# Patient Record
Sex: Male | Born: 1985 | Hispanic: No | Marital: Single | Smoking: Current every day smoker
Health system: Southern US, Community
[De-identification: ages and names within clinical notes are randomized; demographics above are authoritative.]

## PROBLEM LIST (undated history)

## (undated) DIAGNOSIS — B009 Herpesviral infection, unspecified: Secondary | ICD-10-CM

## (undated) DIAGNOSIS — F329 Major depressive disorder, single episode, unspecified: Secondary | ICD-10-CM

## (undated) DIAGNOSIS — F419 Anxiety disorder, unspecified: Secondary | ICD-10-CM

## (undated) DIAGNOSIS — T7840XA Allergy, unspecified, initial encounter: Secondary | ICD-10-CM

## (undated) DIAGNOSIS — F1910 Other psychoactive substance abuse, uncomplicated: Secondary | ICD-10-CM

## (undated) DIAGNOSIS — F32A Depression, unspecified: Secondary | ICD-10-CM

## (undated) HISTORY — DX: Anxiety disorder, unspecified: F41.9

## (undated) HISTORY — DX: Depression, unspecified: F32.A

## (undated) HISTORY — DX: Major depressive disorder, single episode, unspecified: F32.9

## (undated) HISTORY — DX: Allergy, unspecified, initial encounter: T78.40XA

## (undated) HISTORY — DX: Other psychoactive substance abuse, uncomplicated: F19.10

---
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
## 2013-04-03 ENCOUNTER — Emergency Department (HOSPITAL_COMMUNITY)
Admission: EM | Admit: 2013-04-03 | Discharge: 2013-04-04 | Disposition: A | Discharge status: Home / Self Care | Payer: Self-pay | Attending: Emergency Medicine | Admitting: Emergency Medicine

## 2013-04-03 ENCOUNTER — Encounter (HOSPITAL_COMMUNITY): Payer: Self-pay | Admitting: Emergency Medicine

## 2013-04-03 DIAGNOSIS — T7840XA Allergy, unspecified, initial encounter: Secondary | ICD-10-CM

## 2013-04-03 DIAGNOSIS — F17200 Nicotine dependence, unspecified, uncomplicated: Secondary | ICD-10-CM | POA: Insufficient documentation

## 2013-04-03 DIAGNOSIS — R220 Localized swelling, mass and lump, head: Secondary | ICD-10-CM | POA: Insufficient documentation

## 2013-04-03 DIAGNOSIS — T495X1A Poisoning by ophthalmological drugs and preparations, accidental (unintentional), initial encounter: Secondary | ICD-10-CM | POA: Insufficient documentation

## 2013-04-03 DIAGNOSIS — R221 Localized swelling, mass and lump, neck: Secondary | ICD-10-CM

## 2013-04-03 DIAGNOSIS — Y929 Unspecified place or not applicable: Secondary | ICD-10-CM | POA: Insufficient documentation

## 2013-04-03 DIAGNOSIS — R21 Rash and other nonspecific skin eruption: Secondary | ICD-10-CM | POA: Insufficient documentation

## 2013-04-03 DIAGNOSIS — Y9389 Activity, other specified: Secondary | ICD-10-CM | POA: Insufficient documentation

## 2013-04-03 NOTE — ED Notes (Signed)
Pt arrived to the ED with a complaint of a allergic reaction. 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  Pt's symptoms have been present for the last three days but only at night.  Pt has red rash all over his body.  Pt has a swollen lip that has been present since tonight.  Pt is in no apparrent distress and able to verbalize complaint without effort.

## 2013-04-04 MED ORDER — PREDNISONE 20 MG PO TABS
60.0000 mg | ORAL_TABLET | Freq: Once | ORAL | Status: AC
  Administered 2013-04-04: 60 mg via ORAL
  Filled 2013-04-04: qty 3

## 2013-04-04 MED ORDER — DIPHENHYDRAMINE HCL 25 MG PO CAPS
25.0000 mg | ORAL_CAPSULE | Freq: Once | ORAL | Status: AC
  Administered 2013-04-04: 25 mg via ORAL
  Filled 2013-04-04: qty 1

## 2013-04-04 MED ORDER — PREDNISONE 20 MG PO TABS: 60.0000 mg | ORAL_TABLET | Freq: Every day | ORAL | Status: DC

## 2013-04-04 MED ORDER — DIPHENHYDRAMINE HCL 25 MG PO CAPS
25.0000 mg | ORAL_CAPSULE | Freq: Four times a day (QID) | ORAL | Status: DC | PRN
Start: 2013-04-04 — End: 2013-06-10

## 2013-04-04 MED ORDER — FAMOTIDINE 20 MG PO TABS
20.0000 mg | ORAL_TABLET | Freq: Once | ORAL | Status: AC
  Administered 2013-04-04: 20 mg via ORAL
  Filled 2013-04-04: qty 1

## 2013-04-04 MED ORDER — EPINEPHRINE 0.3 MG/0.3ML IJ SOAJ: 0.3000 mg | INTRAMUSCULAR | Status: DC | PRN

## 2013-04-04 NOTE — Discharge Instructions (Signed)
Allergies °Allergies may happen from anything your body is sensitive to. This may be food, medicines, pollens, chemicals, and nearly anything around you in everyday life that produces allergens. An allergen is anything that causes an allergy producing substance. Heredity is often a factor in causing these problems. This means you may have some of the same allergies as your parents. °Food allergies happen in all age groups. Food allergies are some of the most severe and life threatening. Some common food allergies are cow's milk, seafood, eggs, nuts, wheat, and soybeans. °SYMPTOMS  °· Swelling around the mouth. °· An itchy red rash or hives. °· Vomiting or diarrhea. °· Difficulty breathing. °SEVERE ALLERGIC REACTIONS ARE LIFE-THREATENING. °This reaction is called anaphylaxis. It can cause the mouth and throat to swell and cause difficulty with breathing and swallowing. In severe reactions only a trace amount of food (for example, peanut oil in a salad) may cause death within seconds. °Seasonal allergies occur in all age groups. These are seasonal because they usually occur during the same season every year. They may be a reaction to molds, grass pollens, or tree pollens. Other causes of problems are house dust mite allergens, pet dander, and mold spores. The symptoms often consist of nasal congestion, a runny itchy nose associated with sneezing, and tearing itchy eyes. There is often an associated itching of the mouth and ears. The problems happen when you come in contact with pollens and other allergens. Allergens are the particles in the air that the body reacts to with an allergic reaction. This causes you to release allergic antibodies. Through a chain of events, these eventually cause you to release histamine into the blood stream. Although it is meant to be protective to the body, it is this release that causes your discomfort. This is why you were given anti-histamines to feel better.  If you are unable to  pinpoint the offending allergen, it may be determined by skin or blood testing. Allergies cannot be cured but can be controlled with medicine. °Hay fever is a collection of all or some of the seasonal allergy problems. It may often be treated with simple over-the-counter medicine such as diphenhydramine. Take medicine as directed. Do not drink alcohol or drive while taking this medicine. Check with your caregiver or package insert for child dosages. °If these medicines are not effective, there are many new medicines your caregiver can prescribe. Stronger medicine such as nasal spray, eye drops, and corticosteroids may be used if the first things you try do not work well. Other treatments such as immunotherapy or desensitizing injections can be used if all else fails. Follow up with your caregiver if problems continue. These seasonal allergies are usually not life threatening. They are generally more of a nuisance that can often be handled using medicine. °HOME CARE INSTRUCTIONS  °· If unsure what causes a reaction, keep a diary of foods eaten and symptoms that follow. Avoid foods that cause reactions. °· If hives or rash are present: °· Take medicine as directed. °· You may use an over-the-counter antihistamine (diphenhydramine) for hives and itching as needed. °· Apply cold compresses (cloths) to the skin or take baths in cool water. Avoid hot baths or showers. Heat will make a rash and itching worse. °· If you are severely allergic: °· Following a treatment for a severe reaction, hospitalization is often required for closer follow-up. °· Wear a medic-alert bracelet or necklace stating the allergy. °· You and your family must learn how to give adrenaline or use   an anaphylaxis kit. °· If you have had a severe reaction, always carry your anaphylaxis kit or EpiPen® with you. Use this medicine as directed by your caregiver if a severe reaction is occurring. Failure to do so could have a fatal outcome. °SEEK MEDICAL  CARE IF: °· You suspect a food allergy. Symptoms generally happen within 30 minutes of eating a food. °· Your symptoms have not gone away within 2 days or are getting worse. °· You develop new symptoms. °· You want to retest yourself or your child with a food or drink you think causes an allergic reaction. Never do this if an anaphylactic reaction to that food or drink has happened before. Only do this under the care of a caregiver. °SEEK IMMEDIATE MEDICAL CARE IF:  °· You have difficulty breathing, are wheezing, or have a tight feeling in your chest or throat. °· You have a swollen mouth, or you have hives, swelling, or itching all over your body. °· You have had a severe reaction that has responded to your anaphylaxis kit or an EpiPen®. These reactions may return when the medicine has worn off. These reactions should be considered life threatening. °MAKE SURE YOU:  °· Understand these instructions. °· Will watch your condition. °· Will get help right away if you are not doing well or get worse. °Document Released: 05/07/2002 Document Revised: 06/08/2012 Document Reviewed: 10/12/2007 °[employer]® Patient Information ©2014 [employer]. ° °

## 2013-04-04 NOTE — ED Provider Notes (Signed)
CSN: [id number]     Arrival date & time 04/03/13  2314 History   First MD Initiated Contact with Patient 04/04/13 0010     Chief Complaint  Patient presents with  . Allergic Reaction   (Consider location/radiation/quality/duration/timing/severity/associated sxs/prior Treatment) HPI History provided by patient. itchy red rash to extremities and torso onset 3 days ago. No difficulty breathing. No throat swelling. Tonight developed associated swelling to his upper lip prompted him to come to emergency apartment. The swelling is now better - he still has itchy red rash all over. No medications. He denies any new foods. He did recently buy a new detergent and new soap. Symptoms moderate in severity. No history of same. No other known allergens/ exposures.  History reviewed. No pertinent past medical history. History reviewed. No pertinent past surgical history. History reviewed. No pertinent family history. History  Substance Use Topics  . Smoking status: Current Every Day Smoker  . Smokeless tobacco: Not on file  . Alcohol Use: No    Review of Systems  Constitutional: Negative for fever and chills.  HENT: Negative for sore throat, trouble swallowing and voice change.   Eyes: Negative for itching.  Respiratory: Negative for shortness of breath.   Cardiovascular: Negative for chest pain.  Gastrointestinal: Negative for vomiting and abdominal pain.  Genitourinary: Negative for dysuria.  Musculoskeletal: Negative for joint swelling.  Skin: Positive for rash.  Neurological: Negative for headaches.  All other systems reviewed and are negative.    Allergies  Review of patient's allergies indicates no known allergies.  Home Medications  No current outpatient prescriptions on file. BP 120/69  Pulse 100  Temp(Src) 98.4 F (36.9 C) (Oral)  Resp 16  SpO2 93% Physical Exam  Constitutional: He is oriented to person, place, and time. He appears well-developed and well-nourished.  HENT:   Head: Normocephalic and atraumatic.  Mouth/Throat: Oropharynx is clear and moist.  Uvula midline. No angioedema. No lingual, oral or lip swelling appreciated  Eyes: EOM are normal. Pupils are equal, round, and reactive to light.  Neck: Neck supple.  Cardiovascular: Normal rate, regular rhythm and intact distal pulses.   Pulmonary/Chest: Effort normal. No stridor. No respiratory distress. He has no wheezes.  Abdominal: Soft. He exhibits no distension. There is no tenderness.  Musculoskeletal: Normal range of motion. He exhibits no edema.  Neurological: He is alert and oriented to person, place, and time.  Skin: Skin is warm and dry.  Patchy, erythematous, blanching rash involving the torso and extremities. No palmar or oral lesions    ED Course  Procedures (including critical care time) Labs Review Labs Reviewed - No data to display Imaging Review No results found.  Prednisone, Benadryl, Pepcid provided  On recheck, itching and rash improving. No airway involvement.  No anaphylaxis.  Patient agrees to stop new soap or detergent. He will keep a food journal. Prescription for Benadryl and EpiPen. Outpatient resources and referral provided. Patient agrees to strict return precautions. MDM  Diagnosis: Allergic reaction  Likely reaction to new soap/ detergent at home.  Improved with medications provided Vital signs nursing notes reviewed and considered        Sunnie NielsenBrian Mikail Goostree, MD 04/04/13 [phone]

## 2013-06-10 ENCOUNTER — Emergency Department (HOSPITAL_COMMUNITY)
Admission: EM | Admit: 2013-06-10 | Discharge: 2013-06-10 | Disposition: A | Discharge status: Home / Self Care | Payer: Self-pay | Attending: Emergency Medicine | Admitting: Emergency Medicine

## 2013-06-10 ENCOUNTER — Encounter (HOSPITAL_COMMUNITY): Payer: Self-pay | Admitting: Emergency Medicine

## 2013-06-10 DIAGNOSIS — J029 Acute pharyngitis, unspecified: Secondary | ICD-10-CM | POA: Insufficient documentation

## 2013-06-10 DIAGNOSIS — F39 Unspecified mood [affective] disorder: Secondary | ICD-10-CM | POA: Insufficient documentation

## 2013-06-10 DIAGNOSIS — F3289 Other specified depressive episodes: Secondary | ICD-10-CM | POA: Insufficient documentation

## 2013-06-10 DIAGNOSIS — R197 Diarrhea, unspecified: Secondary | ICD-10-CM | POA: Insufficient documentation

## 2013-06-10 DIAGNOSIS — N509 Disorder of male genital organs, unspecified: Secondary | ICD-10-CM | POA: Insufficient documentation

## 2013-06-10 DIAGNOSIS — F17200 Nicotine dependence, unspecified, uncomplicated: Secondary | ICD-10-CM | POA: Insufficient documentation

## 2013-06-10 DIAGNOSIS — F329 Major depressive disorder, single episode, unspecified: Secondary | ICD-10-CM | POA: Insufficient documentation

## 2013-06-10 DIAGNOSIS — M545 Low back pain, unspecified: Secondary | ICD-10-CM | POA: Insufficient documentation

## 2013-06-10 MED ORDER — METHOCARBAMOL 500 MG PO TABS
500.0000 mg | ORAL_TABLET | Freq: Four times a day (QID) | ORAL | Status: DC | PRN
Start: 2013-06-10 — End: 2015-11-15

## 2013-06-10 MED ORDER — IBUPROFEN 800 MG PO TABS: 800.0000 mg | ORAL_TABLET | Freq: Three times a day (TID) | ORAL | Status: DC | PRN

## 2013-06-10 NOTE — ED Provider Notes (Signed)
Medical screening examination/treatment/procedure(s) were performed by non-physician practitioner and as supervising physician I was immediately available for consultation/collaboration.   EKG Interpretation None        Chisum Habenicht M Levester Waldridge, MD 06/10/13 1553 

## 2013-06-10 NOTE — Discharge Instructions (Signed)
Read the information below.  Use the prescribed medication as directed.  Please discuss all new medications with your pharmacist.  You may return to the Emergency Department at any time for worsening condition or any new symptoms that concern you.   If you develop fevers, loss of control of bowel or bladder, weakness or numbness in your legs, or are unable to walk, return to the ER for a recheck.    Back Exercises Back exercises help treat and prevent back injuries. The goal of back exercises is to increase the strength of your abdominal and back muscles and the flexibility of your back. These exercises should be started when you no longer have back pain. Back exercises include:  Pelvic Tilt. Lie on your back with your knees bent. Tilt your pelvis until the lower part of your back is against the floor. Hold this position 5 to 10 sec and repeat 5 to 10 times.  Knee to Chest. Pull first 1 knee up against your chest and hold for 20 to 30 seconds, repeat this with the other knee, and then both knees. This may be done with the other leg straight or bent, whichever feels better.  Sit-Ups or Curl-Ups. Bend your knees 90 degrees. Start with tilting your pelvis, and do a partial, slow sit-up, lifting your trunk only 30 to 45 degrees off the floor. Take at least 2 to 3 seconds for each sit-up. Do not do sit-ups with your knees out straight. If partial sit-ups are difficult, simply do the above but with only tightening your abdominal muscles and holding it as directed.  Hip-Lift. Lie on your back with your knees flexed 90 degrees. Push down with your feet and shoulders as you raise your hips a couple inches off the floor; hold for 10 seconds, repeat 5 to 10 times.  Back arches. Lie on your stomach, propping yourself up on bent elbows. Slowly press on your hands, causing an arch in your low back. Repeat 3 to 5 times. Any initial stiffness and discomfort should lessen with repetition over time.  Shoulder-Lifts.  Lie face down with arms beside your body. Keep hips and torso pressed to floor as you slowly lift your head and shoulders off the floor. Do not overdo your exercises, especially in the beginning. Exercises may cause you some mild back discomfort which lasts for a few minutes; however, if the pain is more severe, or lasts for more than 15 minutes, do not continue exercises until you see your caregiver. Improvement with exercise therapy for back problems is slow.  See your caregivers for assistance with developing a proper back exercise program. Document Released: 03/21/2004 Document Revised: 05/06/2011 Document Reviewed: 12/13/2010 [facility] Patient Information 2014 [place], [place].  Back Pain, Adult Low back pain is very common. About 1 in 5 people have back pain.The cause of low back pain is rarely dangerous. The pain often gets better over time.About half of people with a sudden onset of back pain feel better in just 2 weeks. About 8 in 10 people feel better by 6 weeks.  CAUSES Some common causes of back pain include:  Strain of the muscles or ligaments supporting the spine.  Wear and tear (degeneration) of the spinal discs.  Arthritis.  Direct injury to the back. DIAGNOSIS Most of the time, the direct cause of low back pain is not known.However, back pain can be treated effectively even when the exact cause of the pain is unknown.Answering your caregiver's questions about your overall health and  symptoms is one of the most accurate ways to make sure the cause of your pain is not dangerous. If your caregiver needs more information, he or she may order lab work or imaging tests (X-rays or MRIs).However, even if imaging tests show changes in your back, this usually does not require surgery. HOME CARE INSTRUCTIONS For many people, back pain returns.Since low back pain is rarely dangerous, it is often a condition that people can learn to [facility] Hospitalmanageon their own.   Remain active. It is stressful  on the back to sit or stand in one place. Do not sit, drive, or stand in one place for more than 30 minutes at a time. Take short walks on level surfaces as soon as pain allows.Try to increase the length of time you walk each day.  Do not stay in bed.Resting more than 1 or 2 days can delay your recovery.  Do not avoid exercise or work.Your body is made to move.It is not dangerous to be active, even though your back may hurt.Your back will likely heal faster if you return to being active before your pain is gone.  Pay attention to your body when you bend and lift. Many people have less discomfortwhen lifting if they bend their knees, keep the load close to their bodies,and avoid twisting. Often, the most comfortable positions are those that put less stress on your recovering back.  Find a comfortable position to sleep. Use a firm mattress and lie on your side with your knees slightly bent. If you lie on your back, put a pillow under your knees.  Only take over-the-counter or prescription medicines as directed by your caregiver. Over-the-counter medicines to reduce pain and inflammation are often the most helpful.Your caregiver may prescribe muscle relaxant drugs.These medicines help dull your pain so you can more quickly return to your normal activities and healthy exercise.  Put ice on the injured area.  Put ice in a plastic bag.  Place a towel between your skin and the bag.  Leave the ice on for 15-20 minutes, 03-04 times a day for the first 2 to 3 days. After that, ice and heat may be alternated to reduce pain and spasms.  Ask your caregiver about trying back exercises and gentle massage. This may be of some benefit.  Avoid feeling anxious or stressed.Stress increases muscle tension and can worsen back pain.It is important to recognize when you are anxious or stressed and learn ways to manage it.Exercise is a great option. SEEK MEDICAL CARE IF:  You have pain that is not  relieved with rest or medicine.  You have pain that does not improve in 1 week.  You have new symptoms.  You are generally not feeling well. SEEK IMMEDIATE MEDICAL CARE IF:   You have pain that radiates from your back into your legs.  You develop new bowel or bladder control problems.  You have unusual weakness or numbness in your arms or legs.  You develop nausea or vomiting.  You develop abdominal pain.  You feel faint. Document Released: 02/11/2005 Document Revised: 08/13/2011 Document Reviewed: 07/02/2010 [facility]Exit[facility] Patient Information 2014 [place]ExitCare, [place]LLC.   Emergency Department Resource Guide 1) Find a Doctor and Pay Out of Pocket Although you won't have to find out who is covered by your insurance plan, it is a good idea to ask around and get recommendations. You will then need to call the office and see if the doctor you have chosen will accept you as a new patient and  what types of options they offer for patients who are self-pay. Some doctors offer discounts or will set up payment plans for their patients who do not have insurance, but you will need to ask so you aren't surprised when you get to your appointment.  2) Contact Your Local Health Department Not all health departments have doctors that can see patients for sick visits, but many do, so it is worth a call to see if yours does. If you don't know where your local health department is, you can check in your phone book. The CDC also has a tool to help you locate your state's health department, and many state websites also have listings of all of their local health departments.  3) Find a Walk-in Clinic If your illness is not likely to be very severe or complicated, you may want to try a walk in clinic. These are popping up all over the country in pharmacies, drugstores, and shopping centers. They're usually staffed by nurse practitioners or physician assistants that have been trained to treat common illnesses and  complaints. They're usually fairly quick and inexpensive. However, if you have serious medical issues or chronic medical problems, these are probably not your best option.  No Primary Care Doctor: - Call Health Connect at  [phone] - they can help you locate a primary care doctor that  accepts your insurance, provides certain services, etc. - Physician Referral Service- [phone]  Chronic Pain Problems: Organization         Address  Phone   Notes  [facility]  [phone] Patients need to be referred by their primary care doctor.   Medication Assistance: Organization         Address  Phone   Notes  [facility] Medication [facility] [address] [place], [place] [address] [phone] --Must be a resident of [place] -- Must have NO insurance coverage whatsoever (no Medicaid/ Medicare, etc.) -- The pt. MUST have a primary care doctor that directs their care regularly and follows them in the community   MedAssist  [phone]   [employer]  [phone]    Agencies that provide inexpensive medical care: Organization         Address  Phone   Notes  [facility]  [phone]   [facility]    [phone]   [facility] [address] [place], [place] [address] [phone]   [facility] [address], [place] [phone]) [phone]   [facility]anned Parenthood    [phone]   [facility]    [phone]   [facility]  [address], [place] Phone:  [phone], Fax:  [phone] Hours of Operation:  9 am - 6 pm, M-F.  Also accepts Medicaid/Medicare and self-pay.  [facility]  [address],  Phone: [phone], Fax: [phone]. Hours of Operation:  8:30 am - 5:30 pm, M-F.  Also accepts Medicaid and self-pay.  [facility] [place] [address], [place] Phone: [phone]) [phone]   [facility] [address], [place], [place] [phone], Ext. [phone] Mondays & Thursdays: 7-9 AM.  First 15 patients are seen on a first come, first serve basis.    Medicaid-accepting [facility] [place] Providers:  Retail buyerrganization         Address  Phone  Notes  [facility] [address], [place] [phone] Also accepts self-pay patients.  [facility] [address] Laurell Josephs [place], [place]  [phone]   [facility] [address], [place] [phone]   [facility] [address], [place] [phone]   Renaye Rakers [address], [place]   [phone] Only accepts [place] Access [place] patients after they have their name applied to their card.   Self-Pay (no insurance) in [facility]:  Organization         Address  Phone   Notes  Sickle Cell Patients, [facility] [address] [place], [place] [phone]   [facility] [address] [place], [place] [phone]   [facility]  [address], [place] [phone]   [facility]/Dr. Osei-Bonsu  [address] [place] or [address], [place] [phone] Phone number for both [place] and [place] locations is the same.  [facility]gent [facility] and [facility] [address], [place] [phone]   [facility] [address] [place] or [address] [phone]   [facility] [address], [place] [phone], phone; [phone], fax Sees patients 1st and 3rd Saturday of every month.  Must not qualify for public or private insurance (i.e. Medicaid, Medicare, [place] Health Choice, Veterans' Benefits)  Household income should be no more than 200% of the poverty level  The clinic cannot treat you if you are pregnant or think you are pregnant  Sexually transmitted diseases are not treated at the clinic.    Dental Care: Organization         Address  Phone  Notes  [facility] of [facility] [address] [place], [place] [phone] Accepts children up to age 32 who are enrolled in [place] or [place] Health Choice; pregnant women with a Medicaid card; and children who have applied for Medicaid or [place] Health Choice, but were declined, whose parents can pay a reduced fee at time of service.  [facility] of [facility]  [address], [place] [phone] Accepts children up to age 28 who are enrolled in [place] or [place] Health Choice; pregnant women with a Medicaid card; and children who have applied for Medicaid or [place] Health Choice, but were declined, whose parents can pay a reduced fee at time of service.  [facility]  [address] [place], [place] [phone] Patients are seen by appointment only. Walk-ins are not accepted. [facility] will see patients 25 years of age and older. Monday - Tuesday (8am-5pm) Most Wednesdays (8:30-5pm) $30 per visit, cash only  [facility] Adult Dental Access PROGRAM  [address], [facility] [phone] Patients are seen by appointment only. Walk-ins are not accepted. [facility] will see patients 60 years of age and older. One Wednesday Evening (Monthly: Volunteer Based).  $30 per visit, cash only  [employer]  [phone] for adults; Children under age 41, call Graduate Pediatric Dentistry at [phone]. Children aged 38-14, please call [phone] to request a pediatric application.  Dental services are provided in all areas of dental care including fillings, crowns and bridges, complete and  partial dentures, implants, gum treatment, root canals, and extractions. Preventive care is  also provided. Treatment is provided to both adults and children. Patients are selected via a lottery and there is often a waiting list.   [facility] [address] [place]  [phone] [email or web address]   [facility] [address] [place], [place] [phone], Ext. [phone] Second and Fourth Thursday of each month, opens at 6:30 AM; Clinic ends at 9 AM.  Patients are seen on a first-come first-served basis, and a limited number are seen during each clinic.   [facility]  [address] Ether Griffins [place], [place] [phone]   Eligibility Requirements You must have lived in [place], [place], or [place] counties for at least the last three months.   You cannot be eligible for state or federal sponsored [place], including [employer], [place], or [employer].   You generally cannot be eligible for healthcare insurance through your employer.    How to apply: Eligibility screenings are held every Tuesday and Wednesday afternoon from 1:00 pm until 4:00 pm. You do not need an appointment for the interview!  [facility] [address] [place], [place] [phone]   [facility] Health Department  [phone]   [facility] Health Department  [phone]   [facility] Health Department  [phone]    Behavioral Health Resources in the Community: Intensive Outpatient Programs Organization         Address  Phone  Notes  [facility] Services [address], [place], [place] [phone]   [facility] Outpatient [address] [place], [place] [phone]   ADS: Alcohol & Drug Svcs [address], [place], [place]  [phone]   [facility] Mental Health [address]  [place], [place] [phone] or [phone]   Substance Abuse Resources Organization         Address  Phone  Notes  Alcohol and Drug Services  [phone]   [facility]  ([phone]   [facility] [place]  ([phone]   Floydene Flock  [phone]   Residential & Outpatient Substance Abuse Program  [phone]   Psychological Services Organization         Address  Phone  Notes  [facility] Behavioral Health  [phone]   [facility] Services  6125973849   [facility] Mental Health [address] [place] [phone] or [phone]    Mobile Crisis Teams Organization         Address  Phone  Notes  [facility]rapeutic [facility]atives, Mobile Crisis Care Unit  [phone]   [facility]  [address] [place], [place] [phone]   Doristine Locks [address] [place] [phone]    Self-Help/Support Groups Organization         Address  Phone             Notes  Mental Health Assoc. of [place] - variety of support groups  336- I7437963 Call for more information  Narcotics Anonymous (NA), [facility] [address], [employer] [place]  2 meetings at this location   Statistician         Address  Phone  Notes  [facility] [address],    [place]  [phone]   [facility]  [address] [place] 8[address]841, [place], [place] [phone]   [facility] [address] [place], [place] [phone] Admissions: 8am-3pm M-F  [facility] [address],    [place], [place] [phone]   [facility] [address] [place], [place], [place] [phone]   [facility] [address],  [place], [place] [phone]   [facility] - Intensive Outpatient [address]ure[address], [place], [place] [phone]   [facility] (Addiction Recovery Care Assoc.) [address]  [place], [place] 2-841-324-4010 or [phone]   [facility] Treatment Services (RTS) [address] [place], [place] [phone] Accepts Medicaid  [facility] [place] [address],  [place] [phone]  Substance Abuse/Addiction Treatment   [facility] Organization         Address  Phone  Notes  [facility]  [phone]   Angie Fava, PhD [address] Ervin Knack [place], [place]   [phone] or 8431616112   [facility] Behavioral   [address] [place], [place] [phone]   [facility] [address], [place], [place] [phone] Insurance/Medicaid/sponsorship through [facility] and Families [address]                                    [place], [place] [phone] Therapy/tele-psych/case  [facility] [address] [place], [place] [phone]    Dr. Lolly Mustache  [phone]   [facility]  [employer] [facility] Dept. 1) [address], [place] 2) [address] [place] 3)  [address], [place] [phone]  [phone]   [facility] Child Abuse Hotline (754)796-0137 or 5137690790 (After Hours)      Behavioral Health Resources in the [facility]  Intensive Outpatient Programs: [facility]      [address] [place], [place] [phone] Both a day and evening program       [facility] Outpatient     [address]        [place], [place] [address] [phone]         ADS: Alcohol & Drug Svcs [address] [place] [phone]  [facility] Mental Health ACCESS LINE: 8782688222 or [phone] [address] [place], [place] [address] [email or web address]  Mobile Crisis Teams:                                        [facility]atives         Mobile Crisis Care Unit [phone]             [facility] [address] Ginette Otto [phone]                                         Interventionist [address] [place]  [place] [phone]  Self-Help/Support Groups: Mental Health Assoc. of [employer] of support groups (431)212-9197 (call for more info)  Narcotics Anonymous (NA) [facility] [address] [place] - 2 meetings at this location  Residential Treatment Programs:  [facility]      [address]        [place]  [phone]         [facility] [address] [place], [place]  [phone]  [facility]  [address] [place], [place] [address] [phone] Admissions: 8am-3pm M-F  [facility]     [address]        [place], [place] [address]       [phone]         [facility] [address] , [place] [phone]  [facility] [address] [place], [place] [phone]  [facility] - Intensive Outpatient      [address]     [place], [place]       [phone]         [facility] (Addiction Recovery Care Assoc.)     [address] [place], [place] [phone] or [phone]  [facility]idential Treatment Services (RTS)  [address] [place], [place] [phone]  [facility] [address]                                               [address] [place] [phone]  [facility] Resources: [place] [facility] Human Services514 [phone]               General Therapy                                                Angie FavaJulie Brannon, PhD        [address] [place]                                       [place], [place] [address]         [phone]   Insurance  [facility] [place]   [address] [place], [place] [address] [phone]  [facility] [address] [place], [place] [address] [phone] Insurance/Medicaid/sponsorship through [facility]  Faith and Families                                              [address]                                         [place], [place] [address]    Therapy/tele-psych/case         [phone]          [facility] [address].   [place], [place]  [phone]  Adolescent/group home/case management [phone]                                           Creola CornJulia Brannon PhD       General therapy       Insurance   [phone]         Dr. Lolly MustacheArfeen Insurance [phone]  M-F  [employer], sponsorship [phone]

## 2013-06-10 NOTE — Progress Notes (Signed)
[facility] provided pt with a list of primary care resouces to help patient establish primary care.

## 2013-06-10 NOTE — ED Notes (Addendum)
Pt c/o lower left sided back pain x 1 week, hurts when trying to get up. No hx of kidney stones. Denies n/v/d. Pt states he has chronic upper back pain.

## 2013-06-10 NOTE — ED Provider Notes (Signed)
CSN: [id number]     Arrival date & time 06/10/13  1221 History  This chart was scribed for non-physician practitioner working with  by Ashley JacobsBrittany Andrews, ED scribe. This patient was seen in room WTR5/WTR5 and the patient's care was started at 12:54 PM.   First MD Initiated Contact with Patient 06/10/13 1246     Chief Complaint  Patient presents with  . Back Pain     (Consider location/radiation/quality/duration/timing/severity/associated sxs/prior Treatment) Patient is a 28 y.o. male presenting with back pain. The history is provided by medical records and the patient. No language interpreter was used.  Back Pain Associated symptoms: no dysuria and no numbness    HPI Comments: Bob Wolf is a 28 y.o. male who presents to the Emergency Department complaining of constant severe left lumbar back pain, onset one week ago. The pain is 10/10 in severity with movement and radiates to his left upper back.  The pain is present only with movement. Pt had right thigh pain that occurred two weeks ago but has since resolved. Pt denies injury and laceration to his legs. He tried tried two Ibuprofen tablets twice a day.  He has a sore throat and rhinorrhea, developed over the past few days. He reports having a hx of allergies and is taking Claritin. Denies chest pain and SOB. Denies leg swelling. 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 Denies SI. He explains spending a great deal of his time in bed. Pt had to move in with his father.  Denies injury and new activities. Nothing seems to make his symptoms better.  Denies hx of IV drug use. Denies hx cancer.   History reviewed. No pertinent past medical history. Past Surgical History  Procedure Laterality Date  . Cholecystectomy     No family history on file. History  Substance Use Topics  . Smoking status: Current Every Day Smoker  . Smokeless tobacco: Not on file  . Alcohol Use: No    Review of  Systems  Constitutional: Positive for activity change.  HENT: Positive for rhinorrhea and sore throat.   Respiratory: Negative for shortness of breath.   Gastrointestinal: Positive for diarrhea (intermittent clear watery for the past 2-3 weeks). Negative for nausea, vomiting and blood in stool.  Genitourinary: Positive for testicular pain ("pulling" sensation, occured once 1 week ago, relieved completely and never recurred ). Negative for dysuria, urgency, hematuria, discharge, scrotal swelling, difficulty urinating and penile pain.  Musculoskeletal: Positive for back pain. Negative for arthralgias, gait problem and myalgias.  Skin: Negative for color change, rash and wound.  Neurological: Negative for numbness.  Psychiatric/Behavioral: Positive for dysphoric mood. Negative for suicidal ideas and self-injury.  All other systems reviewed and are negative.     Allergies  Other  Home Medications   Prior to Admission medications   Medication Sig Start Date End Date Taking? Authorizing Provider  loratadine (CLARITIN) 10 MG tablet Take 10 mg by mouth as needed for allergies.   Yes Historical Provider, MD   BP 123/65  Pulse 78  Temp(Src) 98.3 F (36.8 C) (Oral)  Resp 20  SpO2 97% Physical Exam  Nursing note and vitals reviewed. Constitutional: He appears well-developed and well-nourished. No distress.  HENT:  Head: Normocephalic and atraumatic.  Neck: Neck supple.  Cardiovascular: Normal rate and regular rhythm.   Pulmonary/Chest: Effort normal and breath sounds normal. No respiratory distress. He has no wheezes. He has no rales.  Abdominal: Soft. He exhibits  no distension and no mass. There is no tenderness. There is no rebound and no guarding.  Musculoskeletal:  Spine nontender, no crepitus, or stepoffs. Lower extremities:  Strength 5/5, sensation intact, distal pulses intact.   Left lower back mildly tender to palpation No skin changes    Neurological: He is alert. He  exhibits normal muscle tone.  Skin: He is not diaphoretic.  Psychiatric: His speech is normal. He is slowed. He exhibits a depressed mood. He expresses no suicidal ideation.       ED Course  Procedures (including critical care time) DIAGNOSTIC STUDIES: Oxygen Saturation is 97% on room air, normal by my interpretation.    COORDINATION OF CARE:  1:01 PM Discussed course of care with pt . Pt understands and agrees.    Labs Review Labs Reviewed - No data to display  Imaging Review No results found.   EKG Interpretation None      MDM   Final diagnoses:  Low back pain   Afebrile, nontoxic patient with mechanical low back pain. No red flags.  Pt also depressed, denies SI, but discusses multiple physical symptoms that have come and gone since he lost his job a few weeks ago.  I have encouraged patient to use resources to find help with his depression and to find a primary care provider.  D/C home with ibuprofen, robaxin, exercises for back pain.  PCP follow up.  Discussed  findings, treatment, and follow up  with patient.  Pt given return precautions.  Pt verbalizes understanding and agrees with plan.       I personally performed the services described in this documentation, which was scribed in my presence. The recorded information has been reviewed and is accurate.    Trixie Dredgemily Barlow Harrison, PA-C 06/10/13 1348

## 2015-11-15 ENCOUNTER — Emergency Department (HOSPITAL_COMMUNITY)
Admission: EM | Admit: 2015-11-15 | Discharge: 2015-11-15 | Disposition: A | Discharge status: Home / Self Care | Payer: Self-pay | Attending: Emergency Medicine | Admitting: Emergency Medicine

## 2015-11-15 ENCOUNTER — Encounter (HOSPITAL_COMMUNITY): Payer: Self-pay | Admitting: *Deleted

## 2015-11-15 DIAGNOSIS — M545 Low back pain: Secondary | ICD-10-CM | POA: Insufficient documentation

## 2015-11-15 DIAGNOSIS — Y9389 Activity, other specified: Secondary | ICD-10-CM | POA: Insufficient documentation

## 2015-11-15 DIAGNOSIS — M25561 Pain in right knee: Secondary | ICD-10-CM | POA: Insufficient documentation

## 2015-11-15 DIAGNOSIS — Y92410 Unspecified street and highway as the place of occurrence of the external cause: Secondary | ICD-10-CM | POA: Insufficient documentation

## 2015-11-15 DIAGNOSIS — Y999 Unspecified external cause status: Secondary | ICD-10-CM | POA: Insufficient documentation

## 2015-11-15 DIAGNOSIS — M25512 Pain in left shoulder: Secondary | ICD-10-CM | POA: Insufficient documentation

## 2015-11-15 DIAGNOSIS — M25562 Pain in left knee: Secondary | ICD-10-CM | POA: Insufficient documentation

## 2015-11-15 LAB — I-STAT CHEM 8, ED
BUN: 8 mg/dL (ref 6–20)
CHLORIDE: 102 mmol/L (ref 101–111)
Calcium, Ion: 1.16 mmol/L (ref 1.15–1.40)
Creatinine, Ser: 0.9 mg/dL (ref 0.61–1.24)
Glucose, Bld: 110 mg/dL — ABNORMAL HIGH (ref 65–99)
HCT: 40 % (ref 39.0–52.0)
Hemoglobin: 13.6 g/dL (ref 13.0–17.0)
Potassium: 3.4 mmol/L — ABNORMAL LOW (ref 3.5–5.1)
SODIUM: 143 mmol/L (ref 135–145)
TCO2: 29 mmol/L (ref 0–100)

## 2015-11-15 LAB — ETHANOL: Alcohol, Ethyl (B): <5 mg/dL (ref <5)

## 2015-11-15 MED ORDER — CYCLOBENZAPRINE HCL 10 MG PO TABS: 10.0000 mg | ORAL_TABLET | Freq: Two times a day (BID) | ORAL | 0 refills | Status: DC | PRN

## 2015-11-15 MED ORDER — IBUPROFEN 600 MG PO TABS: 600.0000 mg | ORAL_TABLET | Freq: Four times a day (QID) | ORAL | 0 refills | Status: DC | PRN
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
## 2015-11-15 NOTE — ED Notes (Signed)
Case manager Kim present at bedside

## 2015-11-15 NOTE — Progress Notes (Signed)
entered in d/c instructions Please use the resources provided to you be the ED case manager to assist with finding a doctor for follow up care     use good http://www.cox-reed.biz/rx.com to assist with finding the best place to find medications at the best cost    Next Steps: Schedule an appointment as soon as possible for a visit today
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
## 2015-11-15 NOTE — Discharge Instructions (Signed)
Take your medications as prescribed as needed for pain relief. I recommend applying ice to affected area for 15 minutes 3-4 times daily. Please follow up with a primary care provider from the Resource Guide provided below in 1 week as needed. Please return to the Emergency Department if symptoms worsen or new onset of fever, headache, confusion, chest pain, difficulty breathing, abdominal pain, vomiting, numbness, tingling, weakness.
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
## 2015-11-15 NOTE — ED Notes (Signed)
Pt went to restroom on the way from triage to his room

## 2015-11-15 NOTE — ED Notes (Signed)
ED PA at bedside

## 2016-01-09 ENCOUNTER — Emergency Department (HOSPITAL_COMMUNITY)
Admission: EM | Admit: 2016-01-09 | Discharge: 2016-01-09 | Disposition: A | Discharge status: Home / Self Care | Payer: Self-pay | Attending: Emergency Medicine | Admitting: Emergency Medicine

## 2016-01-09 ENCOUNTER — Encounter (HOSPITAL_COMMUNITY): Payer: Self-pay | Admitting: Emergency Medicine

## 2016-01-09 ENCOUNTER — Emergency Department (HOSPITAL_COMMUNITY): Payer: Self-pay

## 2016-01-09 DIAGNOSIS — A6000 Herpesviral infection of urogenital system, unspecified: Secondary | ICD-10-CM

## 2016-01-09 DIAGNOSIS — A630 Anogenital (venereal) warts: Secondary | ICD-10-CM | POA: Insufficient documentation

## 2016-01-09 DIAGNOSIS — J069 Acute upper respiratory infection, unspecified: Secondary | ICD-10-CM | POA: Insufficient documentation

## 2016-01-09 DIAGNOSIS — A6002 Herpesviral infection of other male genital organs: Secondary | ICD-10-CM | POA: Insufficient documentation

## 2016-01-09 LAB — CBC WITH DIFFERENTIAL/PLATELET
Basophils Absolute: 0 10*3/uL (ref 0.0–0.1)
Basophils Relative: 0 %
EOS ABS: 0 10*3/uL (ref 0.0–0.7)
Eosinophils Relative: 0 %
HCT: 41.2 % (ref 39.0–52.0)
HEMOGLOBIN: 14.3 g/dL (ref 13.0–17.0)
LYMPHS PCT: 12 %
Lymphs Abs: 1.5 10*3/uL (ref 0.7–4.0)
MCH: 30.4 pg (ref 26.0–34.0)
MCHC: 34.7 g/dL (ref 30.0–36.0)
MCV: 87.7 fL (ref 78.0–100.0)
Monocytes Absolute: 1.2 10*3/uL — ABNORMAL HIGH (ref 0.1–1.0)
Monocytes Relative: 10 %
NEUTROS PCT: 78 %
Neutro Abs: 9.3 10*3/uL — ABNORMAL HIGH (ref 1.7–7.7)
Platelets: 321 10*3/uL (ref 150–400)
RBC: 4.7 MIL/uL (ref 4.22–5.81)
RDW: 13.2 % (ref 11.5–15.5)
WBC: 12 10*3/uL — ABNORMAL HIGH (ref 4.0–10.5)

## 2016-01-09 LAB — URINALYSIS, ROUTINE W REFLEX MICROSCOPIC
BILIRUBIN URINE: NEGATIVE
Glucose, UA: NEGATIVE mg/dL
Hgb urine dipstick: NEGATIVE
Ketones, ur: NEGATIVE mg/dL
NITRITE: NEGATIVE
Protein, ur: 30 mg/dL — AB
SPECIFIC GRAVITY, URINE: 1.033 — AB (ref 1.005–1.030)
pH: 6.5 (ref 5.0–8.0)

## 2016-01-09 LAB — COMPREHENSIVE METABOLIC PANEL
ALT: 33 U/L (ref 17–63)
ANION GAP: 10 (ref 5–15)
AST: 26 U/L (ref 15–41)
Albumin: 4.4 g/dL (ref 3.5–5.0)
Alkaline Phosphatase: 106 U/L (ref 38–126)
BUN: 6 mg/dL (ref 6–20)
CHLORIDE: 98 mmol/L — AB (ref 101–111)
CO2: 28 mmol/L (ref 22–32)
Calcium: 9.2 mg/dL (ref 8.9–10.3)
Creatinine, Ser: 0.8 mg/dL (ref 0.61–1.24)
GFR calc non Af Amer: >60 mL/min (ref >60)
Glucose, Bld: 125 mg/dL — ABNORMAL HIGH (ref 65–99)
Potassium: 2.9 mmol/L — ABNORMAL LOW (ref 3.5–5.1)
Sodium: 136 mmol/L (ref 135–145)
Total Bilirubin: 0.6 mg/dL (ref 0.3–1.2)
Total Protein: 8.5 g/dL — ABNORMAL HIGH (ref 6.5–8.1)

## 2016-01-09 LAB — URINE MICROSCOPIC-ADD ON

## 2016-01-09 LAB — LIPASE, BLOOD: LIPASE: 16 U/L (ref 11–51)

## 2016-01-09 MED ORDER — POTASSIUM CHLORIDE CRYS ER 20 MEQ PO TBCR
40.0000 meq | EXTENDED_RELEASE_TABLET | Freq: Once | ORAL | Status: AC
  Administered 2016-01-09: 40 meq via ORAL
  Filled 2016-01-09: qty 2

## 2016-01-09 MED ORDER — CEFTRIAXONE SODIUM 250 MG IJ SOLR
250.0000 mg | Freq: Once | INTRAMUSCULAR | Status: AC
  Administered 2016-01-09: 250 mg via INTRAMUSCULAR
  Filled 2016-01-09: qty 250

## 2016-01-09 MED ORDER — STERILE WATER FOR INJECTION IJ SOLN
INTRAMUSCULAR | Status: AC
  Administered 2016-01-09: 0.9 mL
  Filled 2016-01-09: qty 10

## 2016-01-09 MED ORDER — ACYCLOVIR 400 MG PO TABS: 400.0000 mg | ORAL_TABLET | Freq: Every day | ORAL | 0 refills | Status: AC

## 2016-01-09 MED ORDER — AZITHROMYCIN 250 MG PO TABS
1000.0000 mg | ORAL_TABLET | Freq: Once | ORAL | Status: AC
  Administered 2016-01-09: 1000 mg via ORAL
  Filled 2016-01-09: qty 4
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
## 2016-01-09 NOTE — Progress Notes (Signed)
EDCM spoke to patient at bedside. Patient confirms he does not have a pcp or insurance living in [place].  EDCM provided patient with contact information to [facility], informed patient of services there and walk in times.  EDCM also provided patient with list of pcps who accept self pay patients, list of discount pharmacies and websites needymeds.org and GoodRX.com for medication assistance, phone number to inquire about the orange card, phone number to inquire about Medicaid, phone number to inquire about the Affordable Care Act, financial resources in the community such as local churches, [employer], urban ministries, and dental assistance for uninsured patients.  Patient thankful for resources.  No further EDCM needs at this time. 

## 2016-01-09 NOTE — Discharge Instructions (Signed)
Contact the [place] [facility] to make an appointment.  Take the medications as prescribed.  Avoid intercourse.  Your partner should get checked at the health department or a primary care doctors office

## 2016-01-09 NOTE — ED Provider Notes (Signed)
[facility]-EMERGENCY DEPT Provider Note   CSN: [id number] Arrival date & time: 01/09/16  1605     History   Chief Complaint Chief Complaint  Patient presents with  . Cough  . Mouth Lesions    HPI Bob Wolf is a 30 y.o. male.  HPI Pt has had URI sx for the last week.  He has been coughing and has been congested.  He tried OTC medications. FOr the last week he has also noticed sores in his mouth.  He thinks he might have herpes.   No fevers.  No vomiting.    History reviewed. No pertinent past medical history.  There are no active problems to display for this patient.   Past Surgical History:  Procedure Laterality Date  . CHOLECYSTECTOMY         Home Medications    Prior to Admission medications   Medication Sig Start Date End Date Taking? Authorizing Provider  acetaminophen (TYLENOL) 500 MG tablet Take 1,500 mg by mouth every 6 (six) hours as needed for mild pain, moderate pain, fever or headache.   Yes Historical Provider, MD  Chlorpheniramine-DM (CORICIDIN COUGH/COLD) 4-30 MG TABS Take 2 tablets by mouth daily as needed (for cold/cough).   Yes Historical Provider, MD  acyclovir (ZOVIRAX) 400 MG tablet Take 1 tablet (400 mg total) by mouth 5 (five) times daily. 01/09/16 01/19/16  Linwood DibblesJon Vilma Will, MD  cyclobenzaprine (FLEXERIL) 10 MG tablet Take 1 tablet (10 mg total) by mouth 2 (two) times daily as needed for muscle spasms. Patient not taking: Reported on 01/09/2016 11/15/15   Barrett HenleNicole Elizabeth Nadeau, PA-C  ibuprofen (ADVIL,MOTRIN) 600 MG tablet Take 1 tablet (600 mg total) by mouth every 6 (six) hours as needed. Patient not taking: Reported on 01/09/2016 11/15/15   Barrett HenleNicole Elizabeth Nadeau, PA-C    Family History History reviewed. No pertinent family history.  Social History Social History  Substance Use Topics  . Smoking status: Never Smoker  . Smokeless tobacco: Never Used  . Alcohol use No     Allergies   Other   Review of Systems Review of Systems  All  other systems reviewed and are negative.    Physical Exam Updated Vital Signs BP 129/74 (BP Location: Left Arm)   Pulse 94   Temp 99 F (37.2 C) (Oral)   Resp 16   SpO2 99%   Physical Exam  Constitutional: He appears well-developed and well-nourished. No distress.  HENT:  Head: Normocephalic and atraumatic.  Right Ear: External ear normal.  Left Ear: External ear normal.  Vesicular oral ulcerations  Eyes: Conjunctivae are normal. Right eye exhibits no discharge. Left eye exhibits no discharge. No scleral icterus.  Neck: Neck supple. No tracheal deviation present.  Cardiovascular: Normal rate, regular rhythm and intact distal pulses.   Pulmonary/Chest: Effort normal and breath sounds normal. No stridor. No respiratory distress. He has no wheezes. He has no rales.  Abdominal: Soft. Bowel sounds are normal. He exhibits no distension. There is no tenderness. There is no rebound and no guarding.  Genitourinary:  Genitourinary Comments: Wart like lesions on shaft of the penis, vesicular lesions on the scrotum  Musculoskeletal: He exhibits no edema or tenderness.  Neurological: He is alert. He has normal strength. No cranial nerve deficit (no facial droop, extraocular movements intact, no slurred speech) or sensory deficit. He exhibits normal muscle tone. He displays no seizure activity. Coordination normal.  Skin: Skin is warm and dry. No rash noted.  Psychiatric: He has a normal mood and  affect.  Nursing note and vitals reviewed.    ED Treatments / Results  Labs (all labs ordered are listed, but only abnormal results are displayed) Labs Reviewed  COMPREHENSIVE METABOLIC PANEL - Abnormal; Notable for the following:       Result Value   Potassium 2.9 (*)    Chloride 98 (*)    Glucose, Bld 125 (*)    Total Protein 8.5 (*)    All other components within normal limits  URINALYSIS, ROUTINE W REFLEX MICROSCOPIC (NOT AT [facility]) - Abnormal; Notable for the following:    Color, Urine  AMBER (*)    Specific Gravity, Urine 1.033 (*)    Protein, ur 30 (*)    Leukocytes, UA TRACE (*)    All other components within normal limits  CBC WITH DIFFERENTIAL/PLATELET - Abnormal; Notable for the following:    WBC 12.0 (*)    Neutro Abs 9.3 (*)    Monocytes Absolute 1.2 (*)    All other components within normal limits  URINE MICROSCOPIC-ADD ON - Abnormal; Notable for the following:    Squamous Epithelial / LPF 0-5 (*)    Bacteria, UA RARE (*)    All other components within normal limits  LIPASE, BLOOD  RPR  HIV ANTIBODY (ROUTINE TESTING)     Radiology Dg Chest 2 View  Result Date: 01/09/2016 CLINICAL DATA:  Cough for 1 week EXAM: CHEST  2 VIEW COMPARISON:  None. FINDINGS: Lungs are clear. Heart size and pulmonary vascularity are normal. No adenopathy. No pneumothorax. No bone lesions. IMPRESSION: No edema or consolidation. Electronically Signed   By: Bretta BangWilliam  Woodruff III M.D.   On: 01/09/2016 17:15    Procedures Procedures (including critical care time)  Medications Ordered in ED Medications  potassium chloride SA (K-DUR,KLOR-CON) CR tablet 40 mEq (40 mEq Oral Given 01/09/16 2040)  cefTRIAXone (ROCEPHIN) injection 250 mg (250 mg Intramuscular Given 01/09/16 2041)  azithromycin (ZITHROMAX) tablet 1,000 mg (1,000 mg Oral Given 01/09/16 2040)  sterile water (preservative free) injection (0.9 mLs  Given 01/09/16 2041)     Initial Impression / Assessment and Plan / ED Course  I have reviewed the triage vital signs and the nursing notes.  Pertinent labs & imaging results that were available during my care of the patient were reviewed by me and considered in my medical decision making (see chart for details).  Clinical Course   His cough symptoms are suggestive of a URI. No evidence of pneumonia.  Patient's laboratory tests show mild elevation of his white blood cell count. He also has a decreased potassium level. Patient was given an oral dose of potassium the  emergency room. He was also given Rocephin and azithromycin to cover for possible STD.  Urinalysis does show white blood cells in the urine which suggests urethritis.  Patient does have genital warts on exam. He also has lesions suggestive of herpes genitalia. Recommend the patient follow up with primary care doctor or the health department. I have given a prescription for acyclovir. I recommend that his partner get evaluated as well.  Final Clinical Impressions(s) / ED Diagnoses   Final diagnoses:  Genital herpes simplex, unspecified site  Genital warts  Upper respiratory tract infection, unspecified type    New Prescriptions New Prescriptions   ACYCLOVIR (ZOVIRAX) 400 MG TABLET    Take 1 tablet (400 mg total) by mouth 5 (five) times daily.     Linwood DibblesJon Kolbi Altadonna, MD 01/09/16 [phone]

## 2016-01-11 LAB — RPR: RPR Ser Ql: NONREACTIVE

## 2016-01-11 LAB — HIV ANTIBODY (ROUTINE TESTING W REFLEX): HIV Screen 4th Generation wRfx: NONREACTIVE

## 2016-02-28 ENCOUNTER — Emergency Department (HOSPITAL_COMMUNITY)
Admission: EM | Admit: 2016-02-28 | Discharge: 2016-02-28 | Disposition: A | Discharge status: Home / Self Care | Payer: Self-pay | Attending: Physician Assistant | Admitting: Physician Assistant

## 2016-02-28 ENCOUNTER — Encounter (HOSPITAL_COMMUNITY): Payer: Self-pay | Admitting: Emergency Medicine

## 2016-02-28 DIAGNOSIS — K120 Recurrent oral aphthae: Secondary | ICD-10-CM | POA: Insufficient documentation

## 2016-02-28 DIAGNOSIS — K121 Other forms of stomatitis: Secondary | ICD-10-CM

## 2016-02-28 DIAGNOSIS — S93402A Sprain of unspecified ligament of left ankle, initial encounter: Secondary | ICD-10-CM | POA: Insufficient documentation

## 2016-02-28 DIAGNOSIS — Z91018 Allergy to other foods: Secondary | ICD-10-CM | POA: Insufficient documentation

## 2016-02-28 DIAGNOSIS — Y999 Unspecified external cause status: Secondary | ICD-10-CM | POA: Insufficient documentation

## 2016-02-28 DIAGNOSIS — T781XXA Other adverse food reactions, not elsewhere classified, initial encounter: Secondary | ICD-10-CM | POA: Insufficient documentation

## 2016-02-28 DIAGNOSIS — Y929 Unspecified place or not applicable: Secondary | ICD-10-CM | POA: Insufficient documentation

## 2016-02-28 DIAGNOSIS — X58XXXA Exposure to other specified factors, initial encounter: Secondary | ICD-10-CM | POA: Insufficient documentation

## 2016-02-28 DIAGNOSIS — Y939 Activity, unspecified: Secondary | ICD-10-CM | POA: Insufficient documentation

## 2016-02-28 DIAGNOSIS — T7840XA Allergy, unspecified, initial encounter: Secondary | ICD-10-CM

## 2016-02-28 MED ORDER — METHYLPREDNISOLONE SODIUM SUCC 125 MG IJ SOLR
125.0000 mg | Freq: Once | INTRAMUSCULAR | Status: AC
  Administered 2016-02-28: 125 mg via INTRAMUSCULAR
  Filled 2016-02-28: qty 2

## 2016-02-28 MED ORDER — DIPHENHYDRAMINE HCL 25 MG PO CAPS
25.0000 mg | ORAL_CAPSULE | Freq: Once | ORAL | Status: AC
  Administered 2016-02-28: 25 mg via ORAL
  Filled 2016-02-28: qty 1

## 2016-02-28 NOTE — ED Notes (Signed)
Patient states he would not like the X RAY done but still would like the medicine for the facial swelling.

## 2016-02-28 NOTE — Discharge Instructions (Signed)
Follow up as need for any problems swallowing or breathing. Makes sure to use a was cloth with salt water to clean the areas of your mouth

## 2016-02-28 NOTE — ED Notes (Signed)
Patient Alert and oriented X4. Stable and ambulatory. Patient verbalized understanding of the discharge instructions.  Patient belongings were taken by the patient.  

## 2016-02-28 NOTE — ED Provider Notes (Signed)
[facility]-EMERGENCY DEPT Provider Note   CSN: [id number] Arrival date & time: 02/28/16  1557     History   Chief Complaint Chief Complaint  Patient presents with  . Facial Swelling  . Ankle Pain    HPI Bob Wolf is a 31 y.o. male.  Pt comes in c/o lip swelling that started last night after eating a buffet. 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 Doesn't know if that I what it is from. He states that he is not sob. Denies trouble swallowing. He states that he took 2 doses of benadryl today. He state that he also noted an area of irritation on the inside of his mouth. He states that he is also having pain in his left ankle with some swelling. Denies any known injury but he states that he may have sprained it. No previous injury to the ankle      History reviewed. No pertinent past medical history.  There are no active problems to display for this patient.   Past Surgical History:  Procedure Laterality Date  . CHOLECYSTECTOMY         Home Medications    Prior to Admission medications   Medication Sig Start Date End Date Taking? Authorizing Provider  acetaminophen (TYLENOL) 500 MG tablet Take 1,500 mg by mouth every 6 (six) hours as needed for mild pain, moderate pain, fever or headache.    Historical Provider, MD  Chlorpheniramine-DM (CORICIDIN COUGH/COLD) 4-30 MG TABS Take 2 tablets by mouth daily as needed (for cold/cough).    Historical Provider, MD  cyclobenzaprine (FLEXERIL) 10 MG tablet Take 1 tablet (10 mg total) by mouth 2 (two) times daily as needed for muscle spasms. Patient not taking: Reported on 01/09/2016 11/15/15   Barrett HenleNicole Elizabeth Nadeau, PA-C  ibuprofen (ADVIL,MOTRIN) 600 MG tablet Take 1 tablet (600 mg total) by mouth every 6 (six) hours as needed. Patient not taking: Reported on 01/09/2016 11/15/15   Barrett HenleNicole Elizabeth Nadeau, PA-C    Family History History reviewed. No pertinent family history.  Social History Social History  Substance Use Topics    . Smoking status: Never Smoker  . Smokeless tobacco: Never Used  . Alcohol use No     Allergies   Other   Review of Systems Review of Systems  All other systems reviewed and are negative.    Physical Exam Updated Vital Signs BP 134/85 (BP Location: Right Arm)   Pulse 100   Temp 98.3 F (36.8 C) (Oral)   Resp 18   SpO2 99%   Physical Exam  Constitutional: He is oriented to person, place, and time. He appears well-developed and well-nourished.  HENT:  Head: Normocephalic and atraumatic.  Swelling noted to the bottom [email or web address] ulceration noted to oral mucosa of the bottom lip. No tongue swelling noted  Eyes: Conjunctivae are normal. Pupils are equal, round, and reactive to light.  Neck: Normal range of motion. Neck supple.  Cardiovascular: Normal rate.   Pulmonary/Chest: Effort normal and breath sounds normal.  Musculoskeletal: Normal range of motion.  Swelling to the left ankle. Pulses intact. No redness or warmth noted  Neurological: He is alert and oriented to person, place, and time.  Skin: Skin is warm.  Psychiatric: He has a normal mood and affect.  Nursing note and vitals reviewed.    ED Treatments / Results  Labs (all labs ordered are listed, but only abnormal results are displayed) Labs Reviewed - No data to display  EKG  EKG Interpretation None  Radiology No results found.  Procedures Procedures (including critical care time)  Medications Ordered in ED Medications  methylPREDNISolone sodium succinate (SOLU-MEDROL) 125 mg/2 mL injection 125 mg (not administered)  diphenhydrAMINE (BENADRYL) capsule 25 mg (not administered)     Initial Impression / Assessment and Plan / ED Course  I have reviewed the triage vital signs and the nursing notes.  Pertinent labs & imaging results that were available during my care of the patient were reviewed by me and considered in my medical decision making (see chart for details).  Clinical Course     Pt if refusing x-ray of ankle. Pt in no distress. Swelling could be allergic in nature or related to mouth ulcer. Treated with steroids and benadryl here. Pt wanting to leave prior to seeing if it helped. Discussed strict return precautions with pt  Final Clinical Impressions(s) / ED Diagnoses   Final diagnoses:  None    New Prescriptions New Prescriptions   No medications on file     Teressa Lower, NP 02/28/16 1745    Courteney Lyn Mackuen, MD 02/28/16 2340

## 2016-02-28 NOTE — ED Triage Notes (Signed)
Pt here with lip and throat swelling starting last night around 2am; pt unsure of what could be from; pt sts left ankle pain and swelling also

## 2016-03-11 ENCOUNTER — Encounter: Payer: Self-pay | Admitting: Endocrinology

## 2016-08-05 ENCOUNTER — Encounter (HOSPITAL_COMMUNITY): Payer: Self-pay | Admitting: Emergency Medicine

## 2016-08-05 ENCOUNTER — Emergency Department (HOSPITAL_COMMUNITY)
Admission: EM | Admit: 2016-08-05 | Discharge: 2016-08-05 | Disposition: A | Discharge status: Home / Self Care | Payer: Self-pay | Attending: Emergency Medicine | Admitting: Emergency Medicine

## 2016-08-05 DIAGNOSIS — S0993XA Unspecified injury of face, initial encounter: Secondary | ICD-10-CM

## 2016-08-05 DIAGNOSIS — R51 Headache: Secondary | ICD-10-CM | POA: Insufficient documentation

## 2016-08-05 DIAGNOSIS — Y929 Unspecified place or not applicable: Secondary | ICD-10-CM | POA: Insufficient documentation

## 2016-08-05 DIAGNOSIS — Y999 Unspecified external cause status: Secondary | ICD-10-CM | POA: Insufficient documentation

## 2016-08-05 DIAGNOSIS — Z23 Encounter for immunization: Secondary | ICD-10-CM | POA: Insufficient documentation

## 2016-08-05 DIAGNOSIS — Y9389 Activity, other specified: Secondary | ICD-10-CM | POA: Insufficient documentation

## 2016-08-05 DIAGNOSIS — S01511A Laceration without foreign body of lip, initial encounter: Secondary | ICD-10-CM | POA: Insufficient documentation

## 2016-08-05 MED ORDER — HYDROCODONE-ACETAMINOPHEN 5-325 MG PO TABS: 1.0000 | ORAL_TABLET | Freq: Four times a day (QID) | ORAL | 0 refills | Status: DC | PRN

## 2016-08-05 MED ORDER — TETANUS-DIPHTH-ACELL PERTUSSIS 5-2.5-18.5 LF-MCG/0.5 IM SUSP
0.5000 mL | Freq: Once | INTRAMUSCULAR | Status: AC
  Administered 2016-08-05: 0.5 mL via INTRAMUSCULAR
  Filled 2016-08-05: qty 0.5

## 2016-08-05 MED ORDER — AMOXICILLIN-POT CLAVULANATE 875-125 MG PO TABS: 1.0000 | ORAL_TABLET | Freq: Two times a day (BID) | ORAL | 0 refills | Status: DC

## 2016-08-05 MED ORDER — ACETAMINOPHEN 500 MG PO TABS
1000.0000 mg | ORAL_TABLET | Freq: Once | ORAL | Status: AC
  Administered 2016-08-05: 1000 mg via ORAL
  Filled 2016-08-05: qty 2

## 2016-08-05 NOTE — Discharge Instructions (Signed)
Please take Ibuprofen (Advil, motrin) and Tylenol (acetaminophen) to relieve your pain.  You may take up to 800 MG (4 pills) of normal strength ibuprofen every 8 hours as needed.  In between doses of ibuprofen you make take tylenol, up to 1,000 mg (two extra strength pills).  Do not take more than 3,000 mg tylenol in a 24 hour period.  Please check all medication labels as many medications such as pain and cold medications may contain tylenol.  Do not drink while taking these medications.  Do not take other NSAID'S while taking ibuprofen (such as aleve or naproxen).  You are being prescribed a medication which may make you sleepy. Please follow up of listed precautions for at least 24 hours after taking one dose. please do not drive, operate heavy machinery, care for a small child with out another adult present, or perform any activities that may cause harm to you or someone else if you were to fall asleep or be impaired.
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
## 2016-08-17 ENCOUNTER — Encounter (HOSPITAL_COMMUNITY): Payer: Self-pay | Admitting: Emergency Medicine

## 2016-08-17 ENCOUNTER — Emergency Department (HOSPITAL_COMMUNITY)
Admission: EM | Admit: 2016-08-17 | Discharge: 2016-08-17 | Disposition: A | Discharge status: Home / Self Care | Payer: Self-pay | Attending: Emergency Medicine | Admitting: Emergency Medicine

## 2016-08-17 ENCOUNTER — Encounter (HOSPITAL_COMMUNITY): Payer: Self-pay

## 2016-08-17 ENCOUNTER — Other Ambulatory Visit: Payer: Self-pay

## 2016-08-17 ENCOUNTER — Emergency Department (HOSPITAL_COMMUNITY)
Admission: EM | Admit: 2016-08-17 | Discharge: 2016-08-17 | Discharge status: Against Medical Advice | Payer: Self-pay | Attending: Emergency Medicine | Admitting: Emergency Medicine

## 2016-08-17 DIAGNOSIS — Z5321 Procedure and treatment not carried out due to patient leaving prior to being seen by health care provider: Secondary | ICD-10-CM | POA: Insufficient documentation

## 2016-08-17 DIAGNOSIS — Z79899 Other long term (current) drug therapy: Secondary | ICD-10-CM | POA: Insufficient documentation

## 2016-08-17 DIAGNOSIS — R440 Auditory hallucinations: Secondary | ICD-10-CM | POA: Insufficient documentation

## 2016-08-17 DIAGNOSIS — R441 Visual hallucinations: Secondary | ICD-10-CM | POA: Insufficient documentation

## 2016-08-17 DIAGNOSIS — T7840XA Allergy, unspecified, initial encounter: Secondary | ICD-10-CM | POA: Insufficient documentation

## 2016-08-17 DIAGNOSIS — R220 Localized swelling, mass and lump, head: Secondary | ICD-10-CM | POA: Insufficient documentation

## 2016-08-17 DIAGNOSIS — R509 Fever, unspecified: Secondary | ICD-10-CM

## 2016-08-17 HISTORY — DX: Herpesviral infection, unspecified: B00.9

## 2016-08-17 LAB — ETHANOL: Alcohol, Ethyl (B): <5 mg/dL (ref <5)

## 2016-08-17 LAB — COMPREHENSIVE METABOLIC PANEL
ALT: 30 U/L (ref 17–63)
AST: 38 U/L (ref 15–41)
Albumin: 5 g/dL (ref 3.5–5.0)
Alkaline Phosphatase: 74 U/L (ref 38–126)
Anion gap: 12 (ref 5–15)
BUN: 12 mg/dL (ref 6–20)
CO2: 27 mmol/L (ref 22–32)
Calcium: 10.1 mg/dL (ref 8.9–10.3)
Chloride: 100 mmol/L — ABNORMAL LOW (ref 101–111)
Creatinine, Ser: 1.06 mg/dL (ref 0.61–1.24)
GFR calc Af Amer: >60 mL/min (ref >60)
GFR calc non Af Amer: >60 mL/min (ref >60)
Glucose, Bld: 130 mg/dL — ABNORMAL HIGH (ref 65–99)
Potassium: 4 mmol/L (ref 3.5–5.1)
Sodium: 139 mmol/L (ref 135–145)
Total Bilirubin: 1.2 mg/dL (ref 0.3–1.2)
Total Protein: 9.2 g/dL — ABNORMAL HIGH (ref 6.5–8.1)

## 2016-08-17 LAB — CBC WITH DIFFERENTIAL/PLATELET
Basophils Absolute: 0 10*3/uL (ref 0.0–0.1)
Basophils Relative: 0 %
Eosinophils Absolute: 0 10*3/uL (ref 0.0–0.7)
Eosinophils Relative: 0 %
HCT: 43.7 % (ref 39.0–52.0)
Hemoglobin: 14.9 g/dL (ref 13.0–17.0)
Lymphocytes Relative: 6 %
Lymphs Abs: 1 10*3/uL (ref 0.7–4.0)
MCH: 30.6 pg (ref 26.0–34.0)
MCHC: 34.1 g/dL (ref 30.0–36.0)
MCV: 89.7 fL (ref 78.0–100.0)
Monocytes Absolute: 0.8 10*3/uL (ref 0.1–1.0)
Monocytes Relative: 5 %
Neutro Abs: 13.6 10*3/uL — ABNORMAL HIGH (ref 1.7–7.7)
Neutrophils Relative %: 89 %
Platelets: 378 10*3/uL (ref 150–400)
RBC: 4.87 MIL/uL (ref 4.22–5.81)
RDW: 13.5 % (ref 11.5–15.5)
WBC: 15.4 10*3/uL — ABNORMAL HIGH (ref 4.0–10.5)

## 2016-08-17 LAB — CK: Total CK: 792 U/L — ABNORMAL HIGH (ref 49–397)

## 2016-08-17 MED ORDER — SODIUM CHLORIDE 0.9 % IV BOLUS (SEPSIS)
1000.0000 mL | Freq: Once | INTRAVENOUS | Status: AC
  Administered 2016-08-17: 1000 mL via INTRAVENOUS

## 2016-08-17 MED ORDER — DIPHENHYDRAMINE HCL 50 MG/ML IJ SOLN
25.0000 mg | Freq: Once | INTRAMUSCULAR | Status: AC
  Administered 2016-08-17: 25 mg via INTRAVENOUS
  Filled 2016-08-17: qty 1

## 2016-08-17 MED ORDER — FAMOTIDINE IN NACL 20-0.9 MG/50ML-% IV SOLN
20.0000 mg | Freq: Once | INTRAVENOUS | Status: AC
  Administered 2016-08-17: 20 mg via INTRAVENOUS
  Filled 2016-08-17: qty 50

## 2016-08-17 MED ORDER — SODIUM CHLORIDE 0.9 % IV BOLUS (SEPSIS): 1000.0000 mL | Freq: Once | INTRAVENOUS | Status: DC

## 2016-08-17 MED ORDER — METHYLPREDNISOLONE SODIUM SUCC 125 MG IJ SOLR
125.0000 mg | Freq: Once | INTRAMUSCULAR | Status: AC
  Administered 2016-08-17: 125 mg via INTRAVENOUS
  Filled 2016-08-17: qty 2

## 2016-08-17 MED ORDER — DIPHENHYDRAMINE HCL 25 MG PO CAPS
25.0000 mg | ORAL_CAPSULE | Freq: Once | ORAL | Status: AC
  Administered 2016-08-17: 25 mg via ORAL
  Filled 2016-08-17: qty 1

## 2016-08-17 MED ORDER — LORAZEPAM 2 MG/ML IJ SOLN
0.5000 mg | Freq: Once | INTRAMUSCULAR | Status: DC
  Filled 2016-08-17: qty 1

## 2016-08-17 MED ORDER — DIPHENHYDRAMINE HCL 50 MG/ML IJ SOLN: 12.5000 mg | Freq: Once | INTRAMUSCULAR | Status: DC

## 2016-08-17 MED ORDER — ACETAMINOPHEN 500 MG PO TABS
1000.0000 mg | ORAL_TABLET | Freq: Once | ORAL | Status: DC
  Filled 2016-08-17: qty 2

## 2016-08-17 MED ORDER — DIPHENHYDRAMINE HCL 50 MG/ML IJ SOLN
25.0000 mg | Freq: Once | INTRAMUSCULAR | Status: DC
  Filled 2016-08-17: qty 1

## 2016-08-17 MED ORDER — SODIUM CHLORIDE 0.9 % IV BOLUS (SEPSIS)
500.0000 mL | Freq: Once | INTRAVENOUS | Status: AC
  Administered 2016-08-17: 500 mL via INTRAVENOUS

## 2016-08-17 NOTE — ED Notes (Signed)
Pt signed out AMA, ambulated out of ER

## 2016-08-17 NOTE — ED Notes (Signed)
Pt ambulates with steady gait; no SOB noted.

## 2016-08-17 NOTE — ED Provider Notes (Signed)
Medical screening examination/treatment/procedure(s) were conducted as a shared visit with non-physician practitioner(s) and myself.  I personally evaluated the patient during the encounter.   EKG Interpretation None     pt here with right lower lip swelling  Denies hi/si Pt has appropriate thought and insight  C/o weakness, likely dehydrated Will check labs   Lorre NickAllen, Lex Linhares, MD 08/17/16 1737

## 2016-08-17 NOTE — ED Notes (Signed)
Patient standing at nurses station asking for water, told him he couldn't have anything until after he sees the doctor. Patient stated he wanted to leave but was told he needed to see the doctor first.
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
## 2016-08-17 NOTE — ED Notes (Signed)
Pt seen walking back out to the lobby. States he needs to get his phone out of his car. Primary RN notified.

## 2016-08-17 NOTE — ED Provider Notes (Signed)
[facility]-EMERGENCY DEPT Provider Note   CSN: [id number] Arrival date & time: 08/17/16  [id number]     History   Chief Complaint Chief Complaint  Patient presents with  . Allergic Reaction    HPI Bob BirchMichael Wolf is a 31 y.o. male.  Patient presents with acute swelling of lower lip and shortness of breath after possible contact with pistachios tonight. He had associated diaphoresis and nausea without vomiting. No LOC, headache, difficulty swallowing. He has had allergic reactions in the past with similar symptoms. No rash or itching.    The history is provided by the patient and a significant other. No language interpreter was used.  Allergic Reaction  Presenting symptoms: no difficulty swallowing and no rash     Past Medical History:  Diagnosis Date  . Herpes     There are no active problems to display for this patient.   Past Surgical History:  Procedure Laterality Date  . CHOLECYSTECTOMY         Home Medications    Prior to Admission medications   Medication Sig Start Date End Date Taking? Authorizing Provider  acetaminophen (TYLENOL) 500 MG tablet Take 1,500 mg by mouth every 6 (six) hours as needed for mild pain, moderate pain, fever or headache.    [provider]  amoxicillin-clavulanate (AUGMENTIN) 875-125 MG tablet Take 1 tablet by mouth every 12 (twelve) hours. 08/05/16   Cristina GongHammond, Wolf W, PA-C  Chlorpheniramine-DM (CORICIDIN COUGH/COLD) 4-30 MG TABS Take 2 tablets by mouth daily as needed (for cold/cough).    [provider]  cyclobenzaprine (FLEXERIL) 10 MG tablet Take 1 tablet (10 mg total) by mouth 2 (two) times daily as needed for muscle spasms. Patient not taking: Reported on 01/09/2016 11/15/15   Bob HenleNadeau, Nicole Elizabeth, PA-C  HYDROcodone-acetaminophen (NORCO/VICODIN) 5-325 MG tablet Take 1-2 tablets by mouth every 6 (six) hours as needed for severe pain. 08/05/16   Cristina GongHammond, Wolf W, PA-C  ibuprofen (ADVIL,MOTRIN) 600 MG tablet Take 1  tablet (600 mg total) by mouth every 6 (six) hours as needed. Patient not taking: Reported on 01/09/2016 11/15/15   Bob HenleNadeau, Nicole Elizabeth, PA-C    Family History No family history on file.  Social History Social History  Substance Use Topics  . Smoking status: Never Smoker  . Smokeless tobacco: Never Used  . Alcohol use No     Allergies   Other   Review of Systems Review of Systems  Constitutional: Positive for diaphoresis. Negative for chills and fever.  HENT: Positive for facial swelling. Negative for trouble swallowing.   Respiratory: Positive for shortness of breath.   Cardiovascular: Negative.   Gastrointestinal: Negative.   Musculoskeletal: Negative.   Skin: Negative.  Negative for rash.  Neurological: Negative.  Negative for weakness, light-headedness and headaches.     Physical Exam Updated Vital Signs BP (!) 140/99 (BP Location: Right Arm)   Pulse (!) 102   Temp 98.3 F (36.8 C) (Oral)   Resp 19   Ht 6\' 2"  (1.88 m)   Wt 98 kg (216 lb)   SpO2 100%   BMI 27.73 kg/m   Physical Exam  Constitutional: He is oriented to person, place, and time. He appears well-developed and well-nourished.  HENT:  Head: Normocephalic.  Right lower lip swollen. Oropharynx is benign. Tongue is not enlarged.   Neck: Normal range of motion. Neck supple.  Cardiovascular: Normal rate and regular rhythm.   No murmur heard. Pulmonary/Chest: Effort normal and breath sounds normal. No stridor. He has no wheezes. He  has no rales.  Abdominal: Soft. Bowel sounds are normal. There is no tenderness. There is no rebound and no guarding.  Musculoskeletal: Normal range of motion.  Neurological: He is alert and oriented to person, place, and time.  Skin: Skin is warm and dry. No rash noted.  Psychiatric: He has a normal mood and affect.     ED Treatments / Results  Labs (all labs ordered are listed, but only abnormal results are displayed) Labs Reviewed - No data to display  EKG   EKG Interpretation None       Radiology No results found.  Procedures Procedures (including critical care time)  Medications Ordered in ED Medications  sodium chloride 0.9 % bolus 500 mL (not administered)  methylPREDNISolone sodium succinate (SOLU-MEDROL) 125 mg/2 mL injection 125 mg (not administered)  famotidine (PEPCID) IVPB 20 mg premix (not administered)  diphenhydrAMINE (BENADRYL) injection 25 mg (not administered)     Initial Impression / Assessment and Plan / ED Course  I have reviewed the triage vital signs and the nursing notes.  Pertinent labs & imaging results that were available during my care of the patient were reviewed by me and considered in my medical decision making (see chart for details).     Patient presents with lower lip swelling and feeling it was difficult to swallow and having SOB.   He was given IV solumedrol, pepcid, and benadryl. He feels better, is not having further SOB but lip remains swollen.   Patient observed in ED and without improvement additional medications were ordered. He then states he wanted to sign out and go home. Risk discussed. Patient left the department AMA.   Final Clinical Impressions(s) / ED Diagnoses   Final diagnoses:  None   1. Acute allergic reaction  New Prescriptions New Prescriptions   No medications on file     Elpidio Anis, Cordelia Poche 08/25/16 2321    Ward, Layla Maw, DO 08/26/16 0222

## 2016-08-17 NOTE — ED Notes (Signed)
Pt not in room.

## 2016-08-17 NOTE — ED Provider Notes (Addendum)
Medical screening examination/treatment/procedure(s) were conducted as a shared visit with non-physician practitioner(s) and myself.  I personally evaluated the patient during the encounter.  Patient here with possible allergic reaction. Complaining of right lower lip swelling that started prior to arrival. States he took Augmentin and a possible antiviral for possible herpes when he notices lip with swelling. States symptoms got worse. He feel short of breath but is not wheezing. He is very anxious here. No hives, hypotension. Given Benadryl, Solu-Medrol, IV Pepcid and IV fluids.   On reevaluation, patient's lip swelling is slightly worse. He is tachycardic into the 130s here and warm to touch. He has an oral temperature of 100.7. He denies headache, neck pain or neck stiffness, sore throat, chest pain, cough, diarrhea. States he did have vomiting prior to coming to the emergency department. He has no swelling of his tongue has normal phonation. Posterior oropharynx is normal-appearing. Lungs are clear to auscultation without wheezing or rhonchi. Abdomen soft and nontender. No recent tick bites. No meningismus. Plan is to now obtain labs, chest x-ray and urine to evaluate for possible source of infection. We'll give Tylenol. We'll give him another dose of IV Benadryl and continue to monitor. I do not think this is severe enough at this time to give IM epinephrine but if symptoms continue to worsen, I recommend this medication. He will be signed out to oncoming provider.   Jaretzy Lhommedieu, Layla MawKristen N, DO 08/17/16 [phone]    Patient refusing further IV treatment, labs, x-ray, urine. He states he is ready to go home. I think he may be having a paradoxical reaction to Benadryl which is making him for restlessness and we have offered him Ativan several times. He states he does not want this treatment and wants to leave. He states he feels well. He is oriented 3 and denies drug alcohol use and does not appear intoxicated.  His partner at bedside has urged him to say several times as has the nursing staff and myself. I feel he has capacity to make decision to leave AGAINST MEDICAL ADVICE. We have discussed that his lip swelling could spread into his tongue, airway into be life-threatening to him. He verbalizes understanding and is able to verbalize these risks back to me.   8:12 AM Patient has decided to leave against medical advice without further workup.  We discussed the nature and purpose, risks and benefits, as well as, the alternatives of treatment. Time was given to allow the opportunity to ask questions and consider their options, and after the discussion, the patient decided to refuse further testing and/or offerred treatment. The patient was informed that refusal could lead to, but was not limited to, death, permanent disability, severe pain, worsening symptoms, disease progression. If present, I asked friends/family to dissuade them without success. Prior to refusing, I determined that the patient had the capacity to make their decision, does not appear clinically intoxicated and understood the consequences of that decision. Outpatient follow up has been encouraged and provided as needed.  Recommended patient return to the hospital if symptoms worsen, change or they would like further treatment and evaluation.     Amariyon Maynes, Layla MawKristen N, DO 08/17/16 [phone]

## 2016-08-17 NOTE — ED Notes (Signed)
Pt not in room. Pt left after triage prior to MD assessment.
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
## 2016-08-17 NOTE — ED Notes (Signed)
Pt very agitated, wanting to leave, Dr. Elesa MassedWard at bedside extensively explaining risks of leaving, pt understanding,

## 2016-08-17 NOTE — ED Notes (Signed)
Pt with noted swelling to right lower lip. No airway swelling noted.

## 2016-08-17 NOTE — ED Notes (Signed)
Patient was in the bathroom with a cup of water for and still didn't provide urine sample.
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
## 2016-08-17 NOTE — ED Notes (Signed)
Pt visualized getting up and walking down hall; pt redirected and asked to sit on bed. Pt calm and cooperative.

## 2016-08-17 NOTE — ED Notes (Signed)
[name], MD at bedside attempt to assess pt. Pt not in room.
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
## 2016-08-17 NOTE — Discharge Instructions (Signed)
Take 25 mg of Benadryl every 4 hours for lip swelling. Apply ice or heat to the affected area for comfort. Follow-up with primary care for reevaluation. Return to the ED if any concerning signs or symptoms develop.

## 2016-08-18 ENCOUNTER — Emergency Department (HOSPITAL_COMMUNITY)
Admission: EM | Admit: 2016-08-18 | Discharge: 2016-08-18 | Disposition: A | Discharge status: Home / Self Care | Payer: Self-pay | Attending: Emergency Medicine | Admitting: Emergency Medicine

## 2016-08-18 ENCOUNTER — Encounter (HOSPITAL_COMMUNITY): Payer: Self-pay

## 2016-08-18 ENCOUNTER — Encounter (HOSPITAL_COMMUNITY): Payer: Self-pay | Admitting: Emergency Medicine

## 2016-08-18 DIAGNOSIS — M6282 Rhabdomyolysis: Secondary | ICD-10-CM | POA: Insufficient documentation

## 2016-08-18 DIAGNOSIS — F19121 Other psychoactive substance abuse with intoxication delirium: Secondary | ICD-10-CM | POA: Insufficient documentation

## 2016-08-18 DIAGNOSIS — K130 Diseases of lips: Secondary | ICD-10-CM | POA: Insufficient documentation

## 2016-08-18 DIAGNOSIS — N189 Chronic kidney disease, unspecified: Secondary | ICD-10-CM | POA: Insufficient documentation

## 2016-08-18 DIAGNOSIS — Z79899 Other long term (current) drug therapy: Secondary | ICD-10-CM | POA: Insufficient documentation

## 2016-08-18 DIAGNOSIS — T783XXA Angioneurotic edema, initial encounter: Secondary | ICD-10-CM | POA: Insufficient documentation

## 2016-08-18 DIAGNOSIS — R410 Disorientation, unspecified: Secondary | ICD-10-CM | POA: Insufficient documentation

## 2016-08-18 LAB — CBC WITH DIFFERENTIAL/PLATELET
BASOS ABS: 0 10*3/uL (ref 0.0–0.1)
Basophils Relative: 0 %
EOS PCT: 0 %
Eosinophils Absolute: 0 10*3/uL (ref 0.0–0.7)
HEMATOCRIT: 40.8 % (ref 39.0–52.0)
Hemoglobin: 14.1 g/dL (ref 13.0–17.0)
LYMPHS ABS: 0.9 10*3/uL (ref 0.7–4.0)
LYMPHS PCT: 6 %
MCH: 30.5 pg (ref 26.0–34.0)
MCHC: 34.6 g/dL (ref 30.0–36.0)
MCV: 88.3 fL (ref 78.0–100.0)
MONO ABS: 1.5 10*3/uL — AB (ref 0.1–1.0)
Monocytes Relative: 9 %
NEUTROS ABS: 14.3 10*3/uL — AB (ref 1.7–7.7)
Neutrophils Relative %: 85 %
Platelets: 350 10*3/uL (ref 150–400)
RBC: 4.62 MIL/uL (ref 4.22–5.81)
RDW: 13.3 % (ref 11.5–15.5)
WBC: 16.8 10*3/uL — ABNORMAL HIGH (ref 4.0–10.5)

## 2016-08-18 LAB — COMPREHENSIVE METABOLIC PANEL
ALBUMIN: 4.5 g/dL (ref 3.5–5.0)
ALT: 51 U/L (ref 17–63)
ANION GAP: 12 (ref 5–15)
AST: 101 U/L — AB (ref 15–41)
Alkaline Phosphatase: 67 U/L (ref 38–126)
BILIRUBIN TOTAL: 1.3 mg/dL — AB (ref 0.3–1.2)
BUN: 17 mg/dL (ref 6–20)
CHLORIDE: 103 mmol/L (ref 101–111)
CO2: 24 mmol/L (ref 22–32)
Calcium: 9.6 mg/dL (ref 8.9–10.3)
Creatinine, Ser: 1.3 mg/dL — ABNORMAL HIGH (ref 0.61–1.24)
GFR calc Af Amer: >60 mL/min (ref >60)
GFR calc non Af Amer: >60 mL/min (ref >60)
GLUCOSE: 146 mg/dL — AB (ref 65–99)
POTASSIUM: 3.6 mmol/L (ref 3.5–5.1)
SODIUM: 139 mmol/L (ref 135–145)
Total Protein: 8.1 g/dL (ref 6.5–8.1)

## 2016-08-18 LAB — ACETAMINOPHEN LEVEL

## 2016-08-18 LAB — CK
CK TOTAL: 3226 U/L — AB (ref 49–397)
Total CK: 3980 U/L — ABNORMAL HIGH (ref 49–397)

## 2016-08-18 LAB — RAPID URINE DRUG SCREEN, HOSP PERFORMED
Amphetamines: NOT DETECTED
BENZODIAZEPINES: POSITIVE — AB
Barbiturates: NOT DETECTED
Cocaine: NOT DETECTED
Opiates: POSITIVE — AB
Tetrahydrocannabinol: POSITIVE — AB

## 2016-08-18 LAB — ETHANOL

## 2016-08-18 MED ORDER — LIP MEDEX EX OINT
TOPICAL_OINTMENT | Freq: Once | CUTANEOUS | Status: AC
  Administered 2016-08-18: 1 via TOPICAL
  Filled 2016-08-18: qty 7

## 2016-08-18 MED ORDER — SODIUM CHLORIDE 0.9 % IV BOLUS (SEPSIS)
2000.0000 mL | Freq: Once | INTRAVENOUS | Status: AC
  Administered 2016-08-18: 2000 mL via INTRAVENOUS

## 2016-08-18 MED ORDER — FAMOTIDINE IN NACL 20-0.9 MG/50ML-% IV SOLN
20.0000 mg | INTRAVENOUS | Status: AC
Start: 2016-08-18 — End: 2016-08-18
  Administered 2016-08-18: 20 mg via INTRAVENOUS
  Filled 2016-08-18: qty 50

## 2016-08-18 MED ORDER — DIPHENHYDRAMINE HCL 25 MG PO CAPS
50.0000 mg | ORAL_CAPSULE | Freq: Once | ORAL | Status: AC
  Administered 2016-08-18: 50 mg via ORAL
  Filled 2016-08-18: qty 2

## 2016-08-18 MED ORDER — AMOXICILLIN-POT CLAVULANATE 875-125 MG PO TABS: 1.0000 | ORAL_TABLET | Freq: Two times a day (BID) | ORAL | 0 refills | Status: DC

## 2016-08-18 MED ORDER — METHYLPREDNISOLONE SODIUM SUCC 125 MG IJ SOLR
125.0000 mg | Freq: Once | INTRAMUSCULAR | Status: AC
  Administered 2016-08-18: 125 mg via INTRAVENOUS
  Filled 2016-08-18: qty 2

## 2016-08-18 NOTE — Discharge Instructions (Signed)
Call any of the numbers listed to get help with your drug problem. You can also call any of the numbers on these instructions to get a primary care physician. Make sure that you drink at least six 8 ounce glasses of water or Gatorade each day in order to stay well-hydrated. Stay out of the heat and in the air conditioning as much as possible Return if concern for any reason.
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
## 2016-08-18 NOTE — Discharge Instructions (Signed)
Take Augmentin as prescribed until finished. We recommend that you swish with warm water after eating to ensure that no food particles become stuck in your wound. Take ibuprofen for inflammation and pain. Do not stop antibiotics early. We advised warm compresses throughout the day to promote drainage. You may return to the emergency department, as needed, for new or concerning symptoms.
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
## 2016-08-18 NOTE — ED Provider Notes (Addendum)
[facility] Provider Note   CSN: [id number] Arrival date & time: 08/18/16  1624     History   Chief Complaint Chief Complaint  Patient presents with  . Altered Mental Status    HPI Bob Wolf is a 31 y.o. male.Patient was found by EMS writhing around outside on a porch of a neighbor. He reports that he's been using"molly", last time 3 days ago also been smoking marijuana and using Vicodin. Last used Vicodin and smoked marijuana a few days ago. He reportedly walked several miles today before being found on neighbors porch. EMS found patient to be agitated. Treated him with Versed IM and Haldol IM he is presently asymptomatic except for mild diffuse headache.  HPI  Past Medical History:  Diagnosis Date  . Herpes     There are no active problems to display for this patient.   Past Surgical History:  Procedure Laterality Date  . CHOLECYSTECTOMY         Home Medications    Prior to Admission medications   Medication Sig Start Date End Date Taking? Authorizing Provider  amoxicillin-clavulanate (AUGMENTIN) 875-125 MG tablet Take 1 tablet by mouth every 12 (twelve) hours. 08/18/16   Antony MaduraHumes, Kelly, PA-C  Chlorpheniramine-DM (CORICIDIN COUGH/COLD) 4-30 MG TABS Take 2 tablets by mouth daily as needed (for cold/cough).    [provider]  cyclobenzaprine (FLEXERIL) 10 MG tablet Take 1 tablet (10 mg total) by mouth 2 (two) times daily as needed for muscle spasms. Patient not taking: Reported on 01/09/2016 11/15/15   Barrett HenleNadeau, Nicole Elizabeth, PA-C  HYDROcodone-acetaminophen (NORCO/VICODIN) 5-325 MG tablet Take 1-2 tablets by mouth every 6 (six) hours as needed for severe pain. Patient not taking: Reported on 08/18/2016 08/05/16   Cristina GongHammond, Elizabeth W, PA-C  ibuprofen (ADVIL,MOTRIN) 600 MG tablet Take 1 tablet (600 mg total) by mouth every 6 (six) hours as needed. Patient not taking: Reported on 01/09/2016 11/15/15   Barrett HenleNadeau, Nicole Elizabeth, PA-C    Family  History No family history on file.  Social History Social History  Substance Use Topics  . Smoking status: Never Smoker  . Smokeless tobacco: Never Used  . Alcohol use 3.6 oz/week    6 Shots of liquor per week     Allergies   Other   Review of Systems Review of Systems  Constitutional: Negative.   HENT: Negative.        Lower lip swelling for several days, getting better  Respiratory: Negative.   Cardiovascular: Positive for chest pain.       Syncope  Gastrointestinal: Negative.   Musculoskeletal: Negative.   Skin: Negative.   Allergic/Immunologic: Negative.   Neurological: Positive for headaches.  Psychiatric/Behavioral: Negative.   All other systems reviewed and are negative.    Physical Exam Updated Vital Signs BP 111/88 (BP Location: Left Arm)   Pulse (!) 109   Temp 99.5 F (37.5 C) (Oral)   Resp 19   Ht 6\' 2"  (1.88 m)   Wt 98 kg (216 lb)   SpO2 96%   BMI 27.73 kg/m   Physical Exam  Constitutional: He is oriented to person, place, and time. He appears well-developed and well-nourished. No distress.  Cooperative Glasgow Coma Score 15. Mucous members dry  HENT:  Head: Normocephalic and atraumatic.  Lower lip minimally swollen, and tender mucosal surface. No trismus. Oropharynx normal  Eyes: Conjunctivae are normal. Pupils are equal, round, and reactive to light.  Neck: Neck supple. No tracheal deviation present. No thyromegaly present.  Cardiovascular: Normal  rate and regular rhythm.   No murmur heard. Pulmonary/Chest: Effort normal and breath sounds normal.  Abdominal: Soft. Bowel sounds are normal. He exhibits no distension. There is no tenderness.  Musculoskeletal: Normal range of motion. He exhibits no edema or tenderness.  Neurological: He is alert and oriented to person, place, and time. Coordination normal.  Skin: Skin is warm and dry. No rash noted.  Psychiatric: He has a normal mood and affect.  Nursing note and vitals reviewed.    ED  Treatments / Results  Labs (all labs ordered are listed, but only abnormal results are displayed) Labs Reviewed  CK  COMPREHENSIVE METABOLIC PANEL  CBC WITH DIFFERENTIAL/PLATELET  ETHANOL  ACETAMINOPHEN LEVEL  RAPID URINE DRUG SCREEN, HOSP PERFORMED    EKG  EKG Interpretation  Date/Time:  Sunday August 18 2016 16:25:42 EDT Ventricular Rate:  102 PR Interval:    QRS Duration: 97 QT Interval:  356 QTC Calculation: 464 R Axis:   67 Text Interpretation:  Sinus tachycardia Borderline low voltage, extremity leads No significant change since last tracing Confirmed by Daira Hine ([id number]) on 08/18/2016 4:38:07 PM       Radiology No results found.  Procedures Procedures (including critical care time)  Medications Ordered in ED Medications  sodium chloride 0.9 % bolus 2,000 mL (not administered)     Initial Impression / Assessment and Plan / ED Course  I have reviewed the triage vital signs and the nursing notes.  Pertinent labs & imaging results that were available during my care of the patient were reviewed by me and considered in my medical decision making (see chart for details).     10  PM patient is alert ambulatory Glasgow Coma Score 15 and no distress. Lab work consistent with mild renal insufficiency and rhabdomyolysis however rhabdomyolysis is improved with intravenous hydration. Suspect that patient was overwhelmed with heat as well as exercising poor judgment secondary to polysubstance abuse. Encourage oral hydration. He'll be supplied reference card for sepsis abuse Final Clinical Impressions(s) / ED Diagnoses  Diagnosis #1 acute delirium-resolved #2 polysubstance abuse #3 rhabdomyolysis #4 renal insufficiency Final diagnoses:  None    New Prescriptions New Prescriptions   No medications on file     Doug Sou, MD 08/18/16 2206    Doug Sou, MD 08/18/16 2218

## 2016-08-18 NOTE — ED Notes (Signed)
Bed: [id number] Expected date:  Expected time:  Means of arrival:  Comments: EMS 31 yo male allergic reaction/0.3 epi
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
## 2016-08-18 NOTE — ED Notes (Signed)
Dr. Ethelda ChickJacubowitz notified of bladder scan.

## 2016-09-23 ENCOUNTER — Encounter: Payer: Self-pay | Admitting: Physician Assistant

## 2016-09-23 ENCOUNTER — Ambulatory Visit (INDEPENDENT_AMBULATORY_CARE_PROVIDER_SITE_OTHER): Payer: Self-pay | Admitting: Physician Assistant

## 2016-09-23 VITALS — BP 120/70 | HR 77 | Temp 98.2°F | Resp 16 | Ht 72.0 in | Wt 307.4 lb

## 2016-09-23 DIAGNOSIS — F419 Anxiety disorder, unspecified: Secondary | ICD-10-CM

## 2016-09-23 DIAGNOSIS — Z91018 Allergy to other foods: Secondary | ICD-10-CM

## 2016-09-23 DIAGNOSIS — F329 Major depressive disorder, single episode, unspecified: Secondary | ICD-10-CM

## 2016-09-23 DIAGNOSIS — F1910 Other psychoactive substance abuse, uncomplicated: Secondary | ICD-10-CM

## 2016-09-23 MED ORDER — FLUOXETINE HCL 20 MG PO TABS
20.0000 mg | ORAL_TABLET | Freq: Every day | ORAL | 3 refills | Status: DC
Start: 2016-09-23 — End: 2016-10-07

## 2016-09-23 NOTE — Progress Notes (Signed)
Patient presents to clinic today to establish care.  Patient endorses sustaining a significant back injury 1 year ago. Was placed on narcotics for pain at that time. 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 Has recently been in a long-term rehabilitation program at [facility]. Patient endorses having to leave 5 days before completion of program. States he had a lot of things going on at home. Patient states he is doing very well. Denies any cravings. Denies any use of pain medication. No marijuana use, alcohol use, etc. Patient endorses while in the program they tried him on multiple medications to help with anxiety/depression. Tried Seroquel, Trazodone, Zoloft, Lexapro and Lyrica. States Lyrica was the only medication that helped with depression symptoms and sleep. Appetite -- appetite has improved since getting out of the facility. Exercise -- none at present. Is going to start working on this.  Sleeping -- Patient has been having some problem falling asleep and staying asleep.   Past Medical History:  Diagnosis Date  . Allergy   . Anxiety   . Depression   . Herpes   . Substance abuse     Past Surgical History:  Procedure Laterality Date  . CHOLECYSTECTOMY  2003    No current outpatient prescriptions on file prior to visit.   No current facility-administered medications on file prior to visit.    Allergies  Allergen Reactions  . Other Swelling    *Pistachios* swelling in left groin and lips    Family History  Problem Relation Age of Onset  . Hypertension Mother   . Diabetes Mother   . Hypertension Father   . Diabetes Father   . Hypertension Sister    Social History   Social History  . Marital status: Single    Spouse name: N/A  . Number of children: N/A  . Years of education: N/A   Occupational History  . unemployed    Social History Main Topics  . Smoking status: Former Smoker    Years: 10.00  . Smokeless tobacco: Never Used     Comment:  recently stopped smoking  . Alcohol use 3.6 oz/week    6 Shots of liquor per week     Comment: 0-1 shot per week  . Drug use: No     Comment: Stopped using at the end of June. Was using 4 x weekly prior to that.  . Sexual activity: Yes   Other Topics Concern  . Not on file   Social History Narrative  . No narrative on file   Review of Systems  Constitutional: Negative for chills, fever, malaise/fatigue and weight loss.  HENT: Negative for hearing loss.   Eyes: Negative for blurred vision and double vision.  Respiratory: Negative for cough and shortness of breath.   Cardiovascular: Negative for chest pain and palpitations.  Neurological: Negative for dizziness and headaches.  Psychiatric/Behavioral: Positive for depression and substance abuse. Negative for hallucinations and suicidal ideas. The patient has insomnia. The patient is not nervous/anxious.    BP 120/70 (BP Location: Left Arm) Comment (Cuff Size): thigh  Pulse 77   Temp 98.2 F (36.8 C) (Oral)   Resp 16   Ht 6' (1.829 m)   Wt (!) 307 lb 6.4 oz (139.4 kg)   SpO2 98%   BMI 41.69 kg/m   Physical Exam  Constitutional: He is oriented to person, place, and time and well-developed, well-nourished, and in no distress.  HENT:  Head: Normocephalic and atraumatic.  Eyes: Conjunctivae are normal.  Neck: Neck supple.  Cardiovascular: Normal rate, regular rhythm, normal heart sounds and intact distal pulses.   Pulmonary/Chest: Effort normal and breath sounds normal. No respiratory distress. He has no wheezes. He has no rales. He exhibits no tenderness.  Neurological: He is alert and oriented to person, place, and time.  Skin: Skin is warm and dry. No rash noted.  Psychiatric: Affect normal.  Vitals reviewed.   Recent Results (from the past 2160 hour(s))  Comprehensive metabolic panel     Status: Abnormal   Collection Time: 08/17/16  4:59 PM  Result Value Ref Range   Sodium 139 135 - 145 mmol/L   Potassium 4.0 3.5 -  5.1 mmol/L   Chloride 100 (L) 101 - 111 mmol/L   CO2 27 22 - 32 mmol/L   Glucose, Bld 130 (H) 65 - 99 mg/dL   BUN 12 6 - 20 mg/dL   Creatinine, Ser 1.06 0.61 - 1.24 mg/dL   Calcium 10.1 8.9 - 10.3 mg/dL   Total Protein 9.2 (H) 6.5 - 8.1 g/dL   Albumin 5.0 3.5 - 5.0 g/dL   AST 38 15 - 41 U/L   ALT 30 17 - 63 U/L   Alkaline Phosphatase 74 38 - 126 U/L   Total Bilirubin 1.2 0.3 - 1.2 mg/dL   GFR calc non Af Amer >60 >60 mL/min   GFR calc Af Amer >60 >60 mL/min    Comment: (NOTE) The eGFR has been calculated using the CKD EPI equation. This calculation has not been validated in all clinical situations. eGFR's persistently <60 mL/min signify possible Chronic Kidney Disease.    Anion gap 12 5 - 15  Ethanol     Status: None   Collection Time: 08/17/16  4:59 PM  Result Value Ref Range   Alcohol, Ethyl (B) <5 <5 mg/dL    Comment:        LOWEST DETECTABLE LIMIT FOR SERUM ALCOHOL IS 5 mg/dL FOR MEDICAL PURPOSES ONLY   CBC with Diff     Status: Abnormal   Collection Time: 08/17/16  4:59 PM  Result Value Ref Range   WBC 15.4 (H) 4.0 - 10.5 K/uL   RBC 4.87 4.22 - 5.81 MIL/uL   Hemoglobin 14.9 13.0 - 17.0 g/dL   HCT 43.7 39.0 - 52.0 %   MCV 89.7 78.0 - 100.0 fL   MCH 30.6 26.0 - 34.0 pg   MCHC 34.1 30.0 - 36.0 g/dL   RDW 13.5 11.5 - 15.5 %   Platelets 378 150 - 400 K/uL   Neutrophils Relative % 89 %   Neutro Abs 13.6 (H) 1.7 - 7.7 K/uL   Lymphocytes Relative 6 %   Lymphs Abs 1.0 0.7 - 4.0 K/uL   Monocytes Relative 5 %   Monocytes Absolute 0.8 0.1 - 1.0 K/uL   Eosinophils Relative 0 %   Eosinophils Absolute 0.0 0.0 - 0.7 K/uL   Basophils Relative 0 %   Basophils Absolute 0.0 0.0 - 0.1 K/uL  CK     Status: Abnormal   Collection Time: 08/17/16  5:32 PM  Result Value Ref Range   Total CK 792 (H) 49 - 397 U/L  CK     Status: Abnormal   Collection Time: 08/18/16  4:47 PM  Result Value Ref Range   Total CK 3,980 (H) 49 - 397 U/L  Comprehensive metabolic panel     Status:  Abnormal   Collection Time: 08/18/16  4:47 PM  Result Value Ref Range   Sodium  139 135 - 145 mmol/L   Potassium 3.6 3.5 - 5.1 mmol/L   Chloride 103 101 - 111 mmol/L   CO2 24 22 - 32 mmol/L   Glucose, Bld 146 (H) 65 - 99 mg/dL   BUN 17 6 - 20 mg/dL   Creatinine, Ser 1.30 (H) 0.61 - 1.24 mg/dL   Calcium 9.6 8.9 - 10.3 mg/dL   Total Protein 8.1 6.5 - 8.1 g/dL   Albumin 4.5 3.5 - 5.0 g/dL   AST 101 (H) 15 - 41 U/L   ALT 51 17 - 63 U/L   Alkaline Phosphatase 67 38 - 126 U/L   Total Bilirubin 1.3 (H) 0.3 - 1.2 mg/dL   GFR calc non Af Amer >60 >60 mL/min   GFR calc Af Amer >60 >60 mL/min    Comment: (NOTE) The eGFR has been calculated using the CKD EPI equation. This calculation has not been validated in all clinical situations. eGFR's persistently <60 mL/min signify possible Chronic Kidney Disease.    Anion gap 12 5 - 15  CBC with Differential/Platelet     Status: Abnormal   Collection Time: 08/18/16  4:47 PM  Result Value Ref Range   WBC 16.8 (H) 4.0 - 10.5 K/uL   RBC 4.62 4.22 - 5.81 MIL/uL   Hemoglobin 14.1 13.0 - 17.0 g/dL   HCT 40.8 39.0 - 52.0 %   MCV 88.3 78.0 - 100.0 fL   MCH 30.5 26.0 - 34.0 pg   MCHC 34.6 30.0 - 36.0 g/dL   RDW 13.3 11.5 - 15.5 %   Platelets 350 150 - 400 K/uL   Neutrophils Relative % 85 %   Neutro Abs 14.3 (H) 1.7 - 7.7 K/uL   Lymphocytes Relative 6 %   Lymphs Abs 0.9 0.7 - 4.0 K/uL   Monocytes Relative 9 %   Monocytes Absolute 1.5 (H) 0.1 - 1.0 K/uL   Eosinophils Relative 0 %   Eosinophils Absolute 0.0 0.0 - 0.7 K/uL   Basophils Relative 0 %   Basophils Absolute 0.0 0.0 - 0.1 K/uL  Ethanol     Status: None   Collection Time: 08/18/16  4:47 PM  Result Value Ref Range   Alcohol, Ethyl (B) <5 <5 mg/dL    Comment:        LOWEST DETECTABLE LIMIT FOR SERUM ALCOHOL IS 5 mg/dL FOR MEDICAL PURPOSES ONLY   Acetaminophen level     Status: Abnormal   Collection Time: 08/18/16  4:47 PM  Result Value Ref Range   Acetaminophen (Tylenol), Serum <10  (L) 10 - 30 ug/mL    Comment:        THERAPEUTIC CONCENTRATIONS VARY SIGNIFICANTLY. A RANGE OF 10-30 ug/mL MAY BE AN EFFECTIVE CONCENTRATION FOR MANY PATIENTS. HOWEVER, SOME ARE BEST TREATED AT CONCENTRATIONS OUTSIDE THIS RANGE. ACETAMINOPHEN CONCENTRATIONS >150 ug/mL AT 4 HOURS AFTER INGESTION AND >50 ug/mL AT 12 HOURS AFTER INGESTION ARE OFTEN ASSOCIATED WITH TOXIC REACTIONS.   Rapid urine drug screen (hospital performed)     Status: Abnormal   Collection Time: 08/18/16  8:15 PM  Result Value Ref Range   Opiates POSITIVE (A) NONE DETECTED   Cocaine NONE DETECTED NONE DETECTED   Benzodiazepines POSITIVE (A) NONE DETECTED   Amphetamines NONE DETECTED NONE DETECTED   Tetrahydrocannabinol POSITIVE (A) NONE DETECTED   Barbiturates NONE DETECTED NONE DETECTED    Comment:        DRUG SCREEN FOR MEDICAL PURPOSES ONLY.  IF CONFIRMATION IS NEEDED FOR ANY PURPOSE, NOTIFY LAB WITHIN  5 DAYS.        LOWEST DETECTABLE LIMITS FOR URINE DRUG SCREEN Drug Class       Cutoff (ng/mL) Amphetamine      1000 Barbiturate      200 Benzodiazepine   873 Tricyclics       730 Opiates          300 Cocaine          300 THC              50   CK     Status: Abnormal   Collection Time: 08/18/16  9:05 PM  Result Value Ref Range   Total CK 3,226 (H) 49 - 397 U/L    Assessment/Plan: Nut allergy Patient has epi-pen  Substance abuse Recent rehabilitation at [facility]. Seems to be doing well. Will obtain records. Discussed local resources.   Anxiety and depression Will start Fluoxetine to help with mood. Discussed will take a few weeks to see a difference. May have to titrate medications. Hesitant to even restart Lyrica due to abuse potential. Follow-up scheduled.     Leeanne Rio, PA-C

## 2016-09-23 NOTE — Patient Instructions (Signed)
Please go to the lab for a urine sample. Start the Fluoxetine as directed daily each morning to help with depression/anxiety. It will take a few weeks to get in the system to make a big difference. As long as you are tolerating medication well, please stick with it and we will titrate the dose.  Start a nightly melatonin 5 mg tablet.  Read through the sleep measures below.  I want you to follow-up with me in 2 weeks.  If there are any acute worsening of symptoms, please go to the ER.  I am working on getting your records to review so we can see about the DMV restrictions.  Sleep Hygiene  Do: (1) Go to bed at the same time each day. (2) Get up from bed at the same time each day. (3) Get regular exercise each day, preferably in the morning.  There is goof evidence that regular exercise improves restful sleep.  This includes stretching and aerobic exercise. (4) Get regular exposure to outdoor or bright lights, especially in the late afternoon. (5) Keep the temperature in your bedroom comfortable. (6) Keep the bedroom quiet when sleeping. (7) Keep the bedroom dark enough to facilitate sleep. (8) Use your bed only for sleep and sex. (9) Take medications as directed.  It is helpful to take prescribed sleeping pills 1 hour before bedtime, so they are causing drowsiness when you lie down, or 10 hours before getting up, to avoid daytime drowsiness. (10) Use a relaxation exercise just before going to sleep -- imagery, massage, warm bath. (11) Keep your feet and hands warm.  Wear warm socks and/or mittens or gloves to bed.  Don't: (1) Exercise just before going to bed. (2) Engage in stimulating activity just before bed, such as playing a competitive game, watching an exciting program on television, or having an important discussion with a loved one. (3) Have caffeine in the evening (coffee, teas, chocolate, sodas, etc.) (4) Read or watch television in bed. (5) Use alcohol to help you sleep. (6)  Go to bed too hungry or too full. (7) Take another person's sleeping pills. (8) Take over-the-counter sleeping pills, without your doctor's knowledge.  Tolerance can develop rapidly with these medications.  Diphenhydramine can have serious side effects for elderly patients. (9) Take daytime naps. (10) Command yourself to go to sleep.  This only makes your mind and body more alert.  If you lie awake for more than 20-30 minutes, get up, go to a different room, participate in a quiet activity (Ex - non-excitable reading or television), and then return to bed when you feel sleepy.  Do this as many times during the night as needed.  This may cause you to have a night or two of poor sleep but it will train your brain to know when it is time for sleep.

## 2016-09-26 ENCOUNTER — Encounter: Payer: Self-pay | Admitting: Physician Assistant

## 2016-09-27 DIAGNOSIS — F419 Anxiety disorder, unspecified: Principal | ICD-10-CM

## 2016-09-27 DIAGNOSIS — F329 Major depressive disorder, single episode, unspecified: Secondary | ICD-10-CM | POA: Insufficient documentation

## 2016-09-27 DIAGNOSIS — F32A Depression, unspecified: Secondary | ICD-10-CM | POA: Insufficient documentation

## 2016-09-27 DIAGNOSIS — F1910 Other psychoactive substance abuse, uncomplicated: Secondary | ICD-10-CM | POA: Insufficient documentation

## 2016-09-27 NOTE — Assessment & Plan Note (Signed)
Recent rehabilitation at [facility]. Seems to be doing well. Will obtain records. Discussed local resources.

## 2016-09-27 NOTE — Assessment & Plan Note (Signed)
Will start Fluoxetine to help with mood. Discussed will take a few weeks to see a difference. May have to titrate medications. Hesitant to even restart Lyrica due to abuse potential. Follow-up scheduled.

## 2016-09-27 NOTE — Assessment & Plan Note (Signed)
Patient has epi-pen

## 2016-10-07 ENCOUNTER — Ambulatory Visit (INDEPENDENT_AMBULATORY_CARE_PROVIDER_SITE_OTHER): Payer: Self-pay | Admitting: Physician Assistant

## 2016-10-07 ENCOUNTER — Encounter: Payer: Self-pay | Admitting: Physician Assistant

## 2016-10-07 DIAGNOSIS — F329 Major depressive disorder, single episode, unspecified: Secondary | ICD-10-CM

## 2016-10-07 DIAGNOSIS — F419 Anxiety disorder, unspecified: Secondary | ICD-10-CM

## 2016-10-07 MED ORDER — FLUOXETINE HCL 20 MG PO CAPS: 20.0000 mg | ORAL_CAPSULE | Freq: Every day | ORAL | 1 refills | Status: DC

## 2016-10-07 NOTE — Progress Notes (Signed)
Patient presents to clinic today for follow-up of anxiety. At last visit, patient was started on Fluoxetine 20 mg daily. States he did not get due to cost. States overall doing well. Anxiety and mood are the same. Denies panic attack since last visit. Denies SI/HI. Does want to give the medication a try now that he has money.   Past Medical History:  Diagnosis Date  . Allergy   . Anxiety   . Depression   . Herpes   . Substance abuse     No current outpatient prescriptions on file prior to visit.   No current facility-administered medications on file prior to visit.     Allergies  Allergen Reactions  . Other Swelling    *Pistachios* swelling in left groin and lips     Family History  Problem Relation Age of Onset  . Hypertension Mother   . Diabetes Mother   . Hypertension Father   . Diabetes Father   . Hypertension Sister     Social History   Social History  . Marital status: Single    Spouse name: N/A  . Number of children: N/A  . Years of education: N/A   Occupational History  . unemployed    Social History Main Topics  . Smoking status: Former Smoker    Years: 10.00  . Smokeless tobacco: Never Used     Comment: recently stopped smoking  . Alcohol use 3.6 oz/week    6 Shots of liquor per week     Comment: 0-1 shot per week  . Drug use: No     Comment: Stopped using at the end of June. Was using 4 x weekly prior to that.  . Sexual activity: Yes   Other Topics Concern  . None   Social History Narrative  . None   Review of Systems - See HPI.  All other ROS are negative.  BP 120/86   Pulse 79   Temp 98.4 F (36.9 C) (Oral)   Resp 16   Ht 6' (1.829 m)   Wt 300 lb (136.1 kg)   SpO2 96%   BMI 40.69 kg/m   Physical Exam  Constitutional: He is oriented to person, place, and time and well-developed, well-nourished, and in no distress.  HENT:  Head: Normocephalic and atraumatic.  Eyes: Conjunctivae are normal.  Cardiovascular: Normal rate,  regular rhythm, normal heart sounds and intact distal pulses.   Pulmonary/Chest: Effort normal.  Neurological: He is alert and oriented to person, place, and time.  Vitals reviewed.   Recent Results (from the past 2160 hour(s))  Comprehensive metabolic panel     Status: Abnormal   Collection Time: 08/17/16  4:59 PM  Result Value Ref Range   Sodium 139 135 - 145 mmol/L   Potassium 4.0 3.5 - 5.1 mmol/L   Chloride 100 (L) 101 - 111 mmol/L   CO2 27 22 - 32 mmol/L   Glucose, Bld 130 (H) 65 - 99 mg/dL   BUN 12 6 - 20 mg/dL   Creatinine, Ser 1.06 0.61 - 1.24 mg/dL   Calcium 10.1 8.9 - 10.3 mg/dL   Total Protein 9.2 (H) 6.5 - 8.1 g/dL   Albumin 5.0 3.5 - 5.0 g/dL   AST 38 15 - 41 U/L   ALT 30 17 - 63 U/L   Alkaline Phosphatase 74 38 - 126 U/L   Total Bilirubin 1.2 0.3 - 1.2 mg/dL   GFR calc non Af Amer >60 >60 mL/min   GFR calc  Af Amer >60 >60 mL/min    Comment: (NOTE) The eGFR has been calculated using the CKD EPI equation. This calculation has not been validated in all clinical situations. eGFR's persistently <60 mL/min signify possible Chronic Kidney Disease.    Anion gap 12 5 - 15  Ethanol     Status: None   Collection Time: 08/17/16  4:59 PM  Result Value Ref Range   Alcohol, Ethyl (B) <5 <5 mg/dL    Comment:        LOWEST DETECTABLE LIMIT FOR SERUM ALCOHOL IS 5 mg/dL FOR MEDICAL PURPOSES ONLY   CBC with Diff     Status: Abnormal   Collection Time: 08/17/16  4:59 PM  Result Value Ref Range   WBC 15.4 (H) 4.0 - 10.5 K/uL   RBC 4.87 4.22 - 5.81 MIL/uL   Hemoglobin 14.9 13.0 - 17.0 g/dL   HCT 43.7 39.0 - 52.0 %   MCV 89.7 78.0 - 100.0 fL   MCH 30.6 26.0 - 34.0 pg   MCHC 34.1 30.0 - 36.0 g/dL   RDW 13.5 11.5 - 15.5 %   Platelets 378 150 - 400 K/uL   Neutrophils Relative % 89 %   Neutro Abs 13.6 (H) 1.7 - 7.7 K/uL   Lymphocytes Relative 6 %   Lymphs Abs 1.0 0.7 - 4.0 K/uL   Monocytes Relative 5 %   Monocytes Absolute 0.8 0.1 - 1.0 K/uL   Eosinophils Relative 0 %    Eosinophils Absolute 0.0 0.0 - 0.7 K/uL   Basophils Relative 0 %   Basophils Absolute 0.0 0.0 - 0.1 K/uL  CK     Status: Abnormal   Collection Time: 08/17/16  5:32 PM  Result Value Ref Range   Total CK 792 (H) 49 - 397 U/L  CK     Status: Abnormal   Collection Time: 08/18/16  4:47 PM  Result Value Ref Range   Total CK 3,980 (H) 49 - 397 U/L  Comprehensive metabolic panel     Status: Abnormal   Collection Time: 08/18/16  4:47 PM  Result Value Ref Range   Sodium 139 135 - 145 mmol/L   Potassium 3.6 3.5 - 5.1 mmol/L   Chloride 103 101 - 111 mmol/L   CO2 24 22 - 32 mmol/L   Glucose, Bld 146 (H) 65 - 99 mg/dL   BUN 17 6 - 20 mg/dL   Creatinine, Ser 1.30 (H) 0.61 - 1.24 mg/dL   Calcium 9.6 8.9 - 10.3 mg/dL   Total Protein 8.1 6.5 - 8.1 g/dL   Albumin 4.5 3.5 - 5.0 g/dL   AST 101 (H) 15 - 41 U/L   ALT 51 17 - 63 U/L   Alkaline Phosphatase 67 38 - 126 U/L   Total Bilirubin 1.3 (H) 0.3 - 1.2 mg/dL   GFR calc non Af Amer >60 >60 mL/min   GFR calc Af Amer >60 >60 mL/min    Comment: (NOTE) The eGFR has been calculated using the CKD EPI equation. This calculation has not been validated in all clinical situations. eGFR's persistently <60 mL/min signify possible Chronic Kidney Disease.    Anion gap 12 5 - 15  CBC with Differential/Platelet     Status: Abnormal   Collection Time: 08/18/16  4:47 PM  Result Value Ref Range   WBC 16.8 (H) 4.0 - 10.5 K/uL   RBC 4.62 4.22 - 5.81 MIL/uL   Hemoglobin 14.1 13.0 - 17.0 g/dL   HCT 40.8 39.0 - 52.0 %  MCV 88.3 78.0 - 100.0 fL   MCH 30.5 26.0 - 34.0 pg   MCHC 34.6 30.0 - 36.0 g/dL   RDW 13.3 11.5 - 15.5 %   Platelets 350 150 - 400 K/uL   Neutrophils Relative % 85 %   Neutro Abs 14.3 (H) 1.7 - 7.7 K/uL   Lymphocytes Relative 6 %   Lymphs Abs 0.9 0.7 - 4.0 K/uL   Monocytes Relative 9 %   Monocytes Absolute 1.5 (H) 0.1 - 1.0 K/uL   Eosinophils Relative 0 %   Eosinophils Absolute 0.0 0.0 - 0.7 K/uL   Basophils Relative 0 %   Basophils  Absolute 0.0 0.0 - 0.1 K/uL  Ethanol     Status: None   Collection Time: 08/18/16  4:47 PM  Result Value Ref Range   Alcohol, Ethyl (B) <5 <5 mg/dL    Comment:        LOWEST DETECTABLE LIMIT FOR SERUM ALCOHOL IS 5 mg/dL FOR MEDICAL PURPOSES ONLY   Acetaminophen level     Status: Abnormal   Collection Time: 08/18/16  4:47 PM  Result Value Ref Range   Acetaminophen (Tylenol), Serum <10 (L) 10 - 30 ug/mL    Comment:        THERAPEUTIC CONCENTRATIONS VARY SIGNIFICANTLY. A RANGE OF 10-30 ug/mL MAY BE AN EFFECTIVE CONCENTRATION FOR MANY PATIENTS. HOWEVER, SOME ARE BEST TREATED AT CONCENTRATIONS OUTSIDE THIS RANGE. ACETAMINOPHEN CONCENTRATIONS >150 ug/mL AT 4 HOURS AFTER INGESTION AND >50 ug/mL AT 12 HOURS AFTER INGESTION ARE OFTEN ASSOCIATED WITH TOXIC REACTIONS.   Rapid urine drug screen (hospital performed)     Status: Abnormal   Collection Time: 08/18/16  8:15 PM  Result Value Ref Range   Opiates POSITIVE (A) NONE DETECTED   Cocaine NONE DETECTED NONE DETECTED   Benzodiazepines POSITIVE (A) NONE DETECTED   Amphetamines NONE DETECTED NONE DETECTED   Tetrahydrocannabinol POSITIVE (A) NONE DETECTED   Barbiturates NONE DETECTED NONE DETECTED    Comment:        DRUG SCREEN FOR MEDICAL PURPOSES ONLY.  IF CONFIRMATION IS NEEDED FOR ANY PURPOSE, NOTIFY LAB WITHIN 5 DAYS.        LOWEST DETECTABLE LIMITS FOR URINE DRUG SCREEN Drug Class       Cutoff (ng/mL) Amphetamine      1000 Barbiturate      200 Benzodiazepine   779 Tricyclics       390 Opiates          300 Cocaine          300 THC              50   CK     Status: Abnormal   Collection Time: 08/18/16  9:05 PM  Result Value Ref Range   Total CK 3,226 (H) 49 - 397 U/L    Assessment/Plan: Anxiety and depression Reviewed [facility] 4 dollar list. Fluoxetine is on this. Will give 78-monthsupply of Fluoxetine 20 mg to make even more affordable per month. Close follow-up scheduled.     MLeeanne Rio PA-C

## 2016-10-07 NOTE — Assessment & Plan Note (Signed)
Reviewed [facility] 4 dollar list. Fluoxetine is on this. Will give 4881-month supply of Fluoxetine 20 mg to make even more affordable per month. Close follow-up scheduled.

## 2016-10-07 NOTE — Patient Instructions (Signed)
Please take the prescription to [facility]. This should make it much cheaper. If you have any issue, call me and let me know.   Take the medication daily as directed. Follow-up with me in 1 month.  Call [facility] and have them send records to me at [phone] since they have not sent anything to us after our requests.

## 2016-10-07 NOTE — Progress Notes (Signed)
Pre visit review using our clinic review tool, if applicable. No additional management support is needed unless otherwise documented below in the visit note. 

## 2016-11-04 ENCOUNTER — Ambulatory Visit: Payer: Self-pay | Admitting: Physician Assistant

## 2016-11-04 DIAGNOSIS — Z0289 Encounter for other administrative examinations: Secondary | ICD-10-CM

## 2016-11-29 ENCOUNTER — Telehealth: Payer: Self-pay | Admitting: General Practice

## 2016-11-29 ENCOUNTER — Ambulatory Visit: Payer: Self-pay | Admitting: Physician Assistant

## 2016-11-29 DIAGNOSIS — Z0289 Encounter for other administrative examinations: Secondary | ICD-10-CM

## 2016-11-29 NOTE — Telephone Encounter (Signed)
Pt came in at noon for his 11:30pm appt. Pt was rescheduled to 12/04/16. Pt did leave paperwork for [facility], charge sheet placed on this and given to PCP.

## 2016-11-29 NOTE — Telephone Encounter (Signed)
Noted. Will complete after visit next week.

## 2016-12-04 ENCOUNTER — Ambulatory Visit (INDEPENDENT_AMBULATORY_CARE_PROVIDER_SITE_OTHER): Payer: Self-pay | Admitting: Physician Assistant

## 2016-12-04 ENCOUNTER — Encounter: Payer: Self-pay | Admitting: Physician Assistant

## 2016-12-04 VITALS — BP 102/78 | HR 77 | Temp 98.0°F | Resp 14 | Ht 72.0 in | Wt 309.0 lb

## 2016-12-04 DIAGNOSIS — Z024 Encounter for examination for driving license: Secondary | ICD-10-CM

## 2016-12-04 NOTE — Progress Notes (Signed)
Pre visit review using our clinic review tool, if applicable. No additional management support is needed unless otherwise documented below in the visit note. 
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
## 2017-04-11 ENCOUNTER — Encounter (HOSPITAL_COMMUNITY): Payer: Self-pay | Admitting: Emergency Medicine

## 2017-04-11 ENCOUNTER — Emergency Department (HOSPITAL_COMMUNITY): Payer: No Typology Code available for payment source

## 2017-04-11 ENCOUNTER — Emergency Department (HOSPITAL_COMMUNITY)
Admission: EM | Admit: 2017-04-11 | Discharge: 2017-04-12 | Disposition: A | Discharge status: Home / Self Care | Payer: No Typology Code available for payment source | Attending: Emergency Medicine | Admitting: Emergency Medicine

## 2017-04-11 ENCOUNTER — Other Ambulatory Visit: Payer: Self-pay

## 2017-04-11 DIAGNOSIS — Z87891 Personal history of nicotine dependence: Secondary | ICD-10-CM | POA: Diagnosis not present

## 2017-04-11 DIAGNOSIS — S0083XA Contusion of other part of head, initial encounter: Secondary | ICD-10-CM | POA: Diagnosis not present

## 2017-04-11 DIAGNOSIS — S0993XA Unspecified injury of face, initial encounter: Secondary | ICD-10-CM | POA: Diagnosis present

## 2017-04-11 DIAGNOSIS — Z79899 Other long term (current) drug therapy: Secondary | ICD-10-CM | POA: Diagnosis not present

## 2017-04-11 DIAGNOSIS — R51 Headache: Secondary | ICD-10-CM | POA: Diagnosis not present

## 2017-04-11 DIAGNOSIS — Y9389 Activity, other specified: Secondary | ICD-10-CM | POA: Insufficient documentation

## 2017-04-11 DIAGNOSIS — R4182 Altered mental status, unspecified: Secondary | ICD-10-CM | POA: Diagnosis not present

## 2017-04-11 DIAGNOSIS — Y999 Unspecified external cause status: Secondary | ICD-10-CM | POA: Insufficient documentation

## 2017-04-11 DIAGNOSIS — Y92410 Unspecified street and highway as the place of occurrence of the external cause: Secondary | ICD-10-CM | POA: Insufficient documentation
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
## 2017-04-11 NOTE — ED Notes (Signed)
Pt to radiology via stretcher.  

## 2017-04-12 LAB — BASIC METABOLIC PANEL
ANION GAP: 12 (ref 5–15)
BUN: 8 mg/dL (ref 6–20)
CALCIUM: 8.9 mg/dL (ref 8.9–10.3)
CO2: 24 mmol/L (ref 22–32)
Chloride: 105 mmol/L (ref 101–111)
Creatinine, Ser: 0.92 mg/dL (ref 0.61–1.24)
GLUCOSE: 119 mg/dL — AB (ref 65–99)
POTASSIUM: 3.9 mmol/L (ref 3.5–5.1)
Sodium: 141 mmol/L (ref 135–145)

## 2017-04-12 LAB — CBC
HEMATOCRIT: 40.4 % (ref 39.0–52.0)
Hemoglobin: 13.3 g/dL (ref 13.0–17.0)
MCH: 30 pg (ref 26.0–34.0)
MCHC: 32.9 g/dL (ref 30.0–36.0)
MCV: 91 fL (ref 78.0–100.0)
PLATELETS: 404 10*3/uL — AB (ref 150–400)
RBC: 4.44 MIL/uL (ref 4.22–5.81)
RDW: 13.9 % (ref 11.5–15.5)
WBC: 10.6 10*3/uL — AB (ref 4.0–10.5)

## 2017-04-12 LAB — ETHANOL: Alcohol, Ethyl (B): <10 mg/dL (ref <10)

## 2017-04-12 NOTE — ED Notes (Signed)
Pt on the [place]

## 2017-04-12 NOTE — ED Notes (Signed)
The pt returned from xray  He iks c/o some ncek pain  Attempting to call soneone

## 2017-11-28 ENCOUNTER — Ambulatory Visit: Payer: Self-pay | Admitting: Physician Assistant

## 2017-11-28 DIAGNOSIS — Z0289 Encounter for other administrative examinations: Secondary | ICD-10-CM

## 2018-01-30 ENCOUNTER — Other Ambulatory Visit: Payer: Self-pay

## 2018-01-30 ENCOUNTER — Ambulatory Visit: Payer: Self-pay | Admitting: Physician Assistant

## 2018-01-30 ENCOUNTER — Encounter: Payer: Self-pay | Admitting: Physician Assistant

## 2018-01-30 VITALS — BP 120/88 | HR 70 | Temp 97.7°F | Resp 16 | Ht 72.0 in | Wt 320.0 lb

## 2018-01-30 DIAGNOSIS — F1910 Other psychoactive substance abuse, uncomplicated: Secondary | ICD-10-CM

## 2018-01-30 DIAGNOSIS — Z024 Encounter for examination for driving license: Secondary | ICD-10-CM
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
## 2018-01-30 NOTE — Patient Instructions (Signed)
I will contact you with results and we will discuss the clearance letter. There may be a further workup needed but I will contact the DMV to make sure we get everything done that is needed.

## 2018-02-04 ENCOUNTER — Telehealth: Payer: Self-pay

## 2018-02-04 LAB — PAIN MGMT, PROFILE 8 W/CONF, U
6 Acetylmorphine: NEGATIVE ng/mL (ref <10)
ALCOHOL METABOLITES: NEGATIVE ng/mL (ref <500)
Amphetamines: NEGATIVE ng/mL (ref <500)
BENZODIAZEPINES: NEGATIVE ng/mL (ref <100)
BUPRENORPHINE, URINE: POSITIVE ng/mL — AB (ref <5)
Benzoylecgonine: 40632 ng/mL — ABNORMAL HIGH (ref <100)
Buprenorphine: 242.8 ng/mL — ABNORMAL HIGH (ref <2)
COCAINE METABOLITE: POSITIVE ng/mL — AB (ref <150)
Creatinine: 154.9 mg/dL
MDMA: NEGATIVE ng/mL (ref <500)
Norbuprenorphine: 430.4 ng/mL — ABNORMAL HIGH (ref <2)
Noroxycodone: 76 ng/mL — ABNORMAL HIGH (ref <50)
OPIATES: NEGATIVE ng/mL (ref <100)
OXYCODONE: NEGATIVE ng/mL (ref <50)
Oxidant: NEGATIVE ug/mL (ref <200)
Oxycodone: POSITIVE ng/mL — AB (ref <100)
Oxymorphone: NEGATIVE ng/mL (ref <50)
PH: 5.75 (ref 4.5–9.0)

## 2018-02-04 NOTE — Telephone Encounter (Signed)
-----   Message from Waldon MerlWilliam C Martin, PA-C sent at 02/04/2018  8:24 AM EST ----- Please check on status of UDS.  ----- Message ----- From: SYSTEM Sent: 02/04/2018  12:09 AM EST To: Waldon MerlWilliam C Martin, PA-C

## 2018-02-04 NOTE — Telephone Encounter (Signed)
UDS was sent to [facility] 01/31/18, can take up to 72 hours to get complete results. [facility] stated that we should get complete results this afternoon or tomorrow. Cocaine conformation is still pending.

## 2019-01-29 ENCOUNTER — Encounter (HOSPITAL_COMMUNITY): Payer: Self-pay

## 2019-01-29 ENCOUNTER — Other Ambulatory Visit: Payer: Self-pay

## 2019-01-29 DIAGNOSIS — N481 Balanitis: Secondary | ICD-10-CM | POA: Insufficient documentation

## 2019-01-29 DIAGNOSIS — A630 Anogenital (venereal) warts: Secondary | ICD-10-CM | POA: Insufficient documentation

## 2019-01-29 DIAGNOSIS — F17210 Nicotine dependence, cigarettes, uncomplicated: Secondary | ICD-10-CM | POA: Insufficient documentation

## 2019-01-29 NOTE — ED Triage Notes (Signed)
Pt coming from home c/o swelling to the foreskin that started last night. No changes in urination. No new sexual partner and last time of intercourse was 8 months ago.

## 2019-01-30 ENCOUNTER — Emergency Department (HOSPITAL_COMMUNITY)
Admission: EM | Admit: 2019-01-30 | Discharge: 2019-01-30 | Disposition: A | Discharge status: Home / Self Care | Payer: Self-pay | Attending: Emergency Medicine | Admitting: Emergency Medicine

## 2019-01-30 ENCOUNTER — Encounter (HOSPITAL_COMMUNITY): Payer: Self-pay | Admitting: Emergency Medicine

## 2019-01-30 ENCOUNTER — Other Ambulatory Visit: Payer: Self-pay

## 2019-01-30 DIAGNOSIS — A630 Anogenital (venereal) warts: Secondary | ICD-10-CM

## 2019-01-30 DIAGNOSIS — N481 Balanitis: Secondary | ICD-10-CM

## 2019-01-30 LAB — URINALYSIS, ROUTINE W REFLEX MICROSCOPIC
Bilirubin Urine: NEGATIVE
Glucose, UA: NEGATIVE mg/dL
Hgb urine dipstick: NEGATIVE
Ketones, ur: NEGATIVE mg/dL
Leukocytes,Ua: NEGATIVE
Nitrite: NEGATIVE
Protein, ur: NEGATIVE mg/dL
Specific Gravity, Urine: 1.02 (ref 1.005–1.030)
pH: 5 (ref 5.0–8.0)

## 2019-01-30 NOTE — ED Provider Notes (Signed)
[facility] DEPT Provider Note   CSN: [id number] Arrival date & time: 01/29/19  2338     History   Chief Complaint Chief Complaint  Patient presents with  . Groin Swelling    HPI Bob Wolf is a 33 y.o. male.     The history is provided by the patient.  Illness Location:  Penis Quality:  Swelling of the foreskin this am now resolved Severity:  Moderate Onset quality:  Gradual Timing:  Constant Progression:  Resolved Chronicity:  New Context:  Uncircumcized  Relieved by:  Nothing Worsened by:  Nothing Ineffective treatments:  None tried Associated symptoms: no abdominal pain, no chest pain, no congestion, no fever and no shortness of breath     Past Medical History:  Diagnosis Date  . Allergy   . Anxiety   . Depression   . Herpes   . Substance abuse [facility])     Patient Active Problem List   Diagnosis Date Noted  . Substance abuse ([place]) 09/27/2016  . Anxiety and depression 09/27/2016  . 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 CHOLECYSTECTOMY  2003        Home Medications    Prior to Admission medications   Medication Sig Start Date End Date Taking? Authorizing Provider  ibuprofen (ADVIL) 200 MG tablet Take 600 mg by mouth every 6 (six) hours as needed for moderate pain.   Yes [provider]  naproxen sodium (ALEVE) 220 MG tablet Take 440 mg by mouth 2 (two) times daily as needed (pain).   Yes [provider]    Family History Family History  Problem Relation Age of Onset  . Hypertension Mother   . Diabetes Mother   . Hypertension Father   . Diabetes Father   . Hypertension Sister     Social History Social History   Tobacco Use  . Smoking status: Current Every Day Smoker    Packs/day: 1.00    Years: 10.00    Pack years: 10.00    Types: Cigarettes  . Smokeless tobacco: Never Used  Substance Use Topics  . Alcohol use: Not Currently   Alcohol/week: 6.0 standard drinks    Types: 6 Shots of liquor per week    Comment: 0-1 shot per week  . Drug use: No    Types: Marijuana    Comment: Stopped using at the end of June. Was using 4 x weekly prior to that.     Allergies   Other   Review of Systems Review of Systems  Constitutional: Negative for fever.  HENT: Negative for congestion.   Eyes: Negative for visual disturbance.  Respiratory: Negative for shortness of breath.   Cardiovascular: Negative for chest pain.  Gastrointestinal: Negative for abdominal pain.  Genitourinary: Positive for penile swelling. Negative for dysuria.  Musculoskeletal: Negative for arthralgias.  Neurological: Negative for dizziness.  Psychiatric/Behavioral: Negative for agitation.  All other systems reviewed and are negative.    Physical Exam Updated Vital Signs BP (!) 142/115   Pulse 75   Temp 98 F (36.7 C) (Oral)   Resp 18   Ht 6' (1.829 m)   Wt (!) 142.9 kg   SpO2 94%   BMI 42.72 kg/m   Physical Exam Vitals signs and nursing note reviewed.  Constitutional:      General: He is not in acute distress.    Appearance: Normal appearance.  HENT:     Head: Normocephalic and atraumatic.  Nose: Nose normal.  Eyes:     Conjunctiva/sclera: Conjunctivae normal.     Pupils: Pupils are equal, round, and reactive to light.  Neck:     Musculoskeletal: Normal range of motion and neck supple.  Cardiovascular:     Rate and Rhythm: Normal rate and regular rhythm.     Pulses: Normal pulses.     Heart sounds: Normal heart sounds.  Pulmonary:     Effort: Pulmonary effort is normal.     Breath sounds: Normal breath sounds.  Abdominal:     General: Abdomen is flat. Bowel sounds are normal.     Tenderness: There is no abdominal tenderness. There is no guarding.  Genitourinary:    Comments: Chaperone present.  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  Foreskin is not swollen at this time Musculoskeletal: Normal  range of motion.  Skin:    General: Skin is warm and dry.     Capillary Refill: Capillary refill takes less than 2 seconds.  Neurological:     General: No focal deficit present.     Mental Status: He is alert and oriented to person, place, and time.  Psychiatric:        Mood and Affect: Mood normal.        Behavior: Behavior normal.      ED Treatments / Results  Labs (all labs ordered are listed, but only abnormal results are displayed) Labs Reviewed - No data to display  EKG None  Radiology No results found.  Procedures Procedures (including critical care time)  Medications Ordered in ED Medications - No data to display   Initial Impression / Assessment and Plan / ED Course  Appears to be a yeast infection, start clotrimazole cream twice daily for 7 days.  Follow up with urology regarding your penile condyloma.    Bob Wolf was evaluated in Emergency Department on 01/30/2019 for the symptoms described in the history of present illness. He was evaluated in the context of the global COVID-19 pandemic, which necessitated consideration that the patient might be at risk for infection with the SARS-CoV-2 virus that causes COVID-19. Institutional protocols and algorithms that pertain to the evaluation of patients at risk for COVID-19 are in a state of rapid change based on information released by regulatory bodies including the CDC and federal and state organizations. These policies and algorithms were followed during the patient's care in the ED.  Final Clinical Impressions(s) / ED Diagnoses   Final diagnoses:  Balanitis  Genital warts    Return for intractable cough, coughing up blood,fevers >100.4 unrelieved by medication, shortness of breath, intractable vomiting, chest pain, shortness of breath, weakness,numbness, changes in speech, facial asymmetry,abdominal pain, passing out,Inability to tolerate liquids or food, cough, altered mental status or any concerns.  No signs of systemic illness or infection. The patient is nontoxic-appearing on exam and vital signs are within normal limits.   I have reviewed the triage vital signs and the nursing notes. Pertinent labs &imaging results that were available during my care of the patient were reviewed by me and considered in my medical decision making (see chart for details).  After history, exam, and medical workup I feel the patient has been appropriately medically screened and is safe for discharge home. Pertinent diagnoses were discussed with the patient. Patient was given return precautions      Jady Braggs, MD 01/30/19 3212

## 2019-01-30 NOTE — Discharge Instructions (Signed)
Use clotrimazole cream on the affected area 2 times daily for 7 days

## 2019-04-08 ENCOUNTER — Other Ambulatory Visit: Payer: Self-pay

## 2019-04-08 ENCOUNTER — Encounter: Payer: Self-pay | Admitting: Family Medicine

## 2019-04-08 ENCOUNTER — Ambulatory Visit: Payer: 59 | Attending: Family Medicine | Admitting: Family Medicine

## 2019-04-08 VITALS — BP 119/73 | HR 91 | Temp 98.0°F | Resp 18 | Ht 72.0 in | Wt 344.0 lb

## 2019-04-08 DIAGNOSIS — R0683 Snoring: Secondary | ICD-10-CM | POA: Diagnosis not present

## 2019-04-08 DIAGNOSIS — L03116 Cellulitis of left lower limb: Secondary | ICD-10-CM | POA: Diagnosis not present

## 2019-04-08 DIAGNOSIS — E66813 Obesity, class 3: Secondary | ICD-10-CM

## 2019-04-08 DIAGNOSIS — R600 Localized edema: Secondary | ICD-10-CM

## 2019-04-08 DIAGNOSIS — R609 Edema, unspecified: Secondary | ICD-10-CM

## 2019-04-08 DIAGNOSIS — Z6841 Body Mass Index (BMI) 40.0 and over, adult: Secondary | ICD-10-CM

## 2019-04-08 MED ORDER — CEPHALEXIN 500 MG PO CAPS: 500.0000 mg | ORAL_CAPSULE | Freq: Three times a day (TID) | ORAL | 0 refills | Status: AC

## 2019-04-08 NOTE — Patient Instructions (Signed)
Cellulitis, Adult  Cellulitis is a skin infection. The infected area is often warm, red, swollen, and sore. It occurs most often in the arms and lower legs. It is very important to get treated for this condition. What are the causes? This condition is caused by bacteria. The bacteria enter through a break in the skin, such as a cut, burn, insect bite, open sore, or crack. What increases the risk? This condition is more likely to occur in people who:  Have a weak body defense system (immune system).  Have open cuts, burns, bites, or scrapes on the skin.  Are older than 33 years of age.  Have a blood sugar problem (diabetes).  Have a long-lasting (chronic) liver disease (cirrhosis) or kidney disease.  Are very overweight (obese).  Have a skin problem, such as: ? Itchy rash (eczema). ? Slow movement of blood in the veins (venous stasis). ? Fluid buildup below the skin (edema).  Have been treated with high-energy rays (radiation).  Use IV drugs. What are the signs or symptoms? Symptoms of this condition include:  Skin that is: ? Red. ? Streaking. ? Spotting. ? Swollen. ? Sore or painful when you touch it. ? Warm.  A fever.  Chills.  Blisters. How is this diagnosed? This condition is diagnosed based on:  Medical history.  Physical exam.  Blood tests.  Imaging tests. How is this treated? Treatment for this condition may include:  Medicines to treat infections or allergies.  Home care, such as: ? Rest. ? Placing cold or warm cloths (compresses) on the skin.  Hospital care, if the condition is very bad. Follow these instructions at home: Medicines  Take over-the-counter and prescription medicines only as told by your doctor.  If you were prescribed an antibiotic medicine, take it as told by your doctor. Do not stop taking it even if you start to feel better. General instructions   Drink enough fluid to keep your pee (urine) pale yellow.  Do not touch  or rub the infected area.  Raise (elevate) the infected area above the level of your heart while you are sitting or lying down.  Place cold or warm cloths on the area as told by your doctor.  Keep all follow-up visits as told by your doctor. This is important. Contact a doctor if:  You have a fever.  You do not start to get better after 1-2 days of treatment.  Your bone or joint under the infected area starts to hurt after the skin has healed.  Your infection comes back. This can happen in the same area or another area.  You have a swollen bump in the area.  You have new symptoms.  You feel ill and have muscle aches and pains. Get help right away if:  Your symptoms get worse.  You feel very sleepy.  You throw up (vomit) or have watery poop (diarrhea) for a long time.  You see red streaks coming from the area.  Your red area gets larger.  Your red area turns dark in color. These symptoms may represent a serious problem that is an emergency. Do not wait to see if the symptoms will go away. Get medical help right away. Call your local emergency services (911 in the [place] Do not drive yourself to the hospital. Summary  Cellulitis is a skin infection. The area is often warm, red, swollen, and sore.  This condition is treated with medicines, rest, and cold and warm cloths.  Take all medicines only   as told by your doctor.  Tell your doctor if symptoms do not start to get better after 1-2 days of treatment. This information is not intended to replace advice given to you by your health care provider. Make sure you discuss any questions you have with your health care provider. Document Revised: 07/03/2017 Document Reviewed: 07/03/2017 Elsevier Patient Education  [place].  Sleep Apnea Sleep apnea affects breathing during sleep. It causes breathing to stop for a short time or to become shallow. It can also increase the risk of:  Heart attack.  Stroke.  Being  very overweight (obese).  Diabetes.  Heart failure.  Irregular heartbeat. The goal of treatment is to help you breathe normally again. What are the causes? There are three kinds of sleep apnea:  Obstructive sleep apnea. This is caused by a blocked or collapsed airway.  Central sleep apnea. This happens when the brain does not send the right signals to the muscles that control breathing.  Mixed sleep apnea. This is a combination of obstructive and central sleep apnea. The most common cause of this condition is a collapsed or blocked airway. This can happen if:  Your throat muscles are too relaxed.  Your tongue and tonsils are too large.  You are overweight.  Your airway is too small. What increases the risk?  Being overweight.  Smoking.  Having a small airway.  Being older.  Being male.  Drinking alcohol.  Taking medicines to calm yourself (sedatives or tranquilizers).  Having family members with the condition. What are the signs or symptoms?  Trouble staying asleep.  Being sleepy or tired during the day.  Getting angry a lot.  Loud snoring.  Headaches in the morning.  Not being able to focus your mind (concentrate).  Forgetting things.  Less interest in sex.  Mood swings.  Personality changes.  Feelings of sadness (depression).  Waking up a lot during the night to pee (urinate).  Dry mouth.  Sore throat. How is this diagnosed?  Your medical history.  A physical exam.  A test that is done when you are sleeping (sleep study). The test is most often done in a sleep lab but may also be done at home. How is this treated?   Sleeping on your side.  Using a medicine to get rid of mucus in your nose (decongestant).  Avoiding the use of alcohol, medicines to help you relax, or certain pain medicines (narcotics).  Losing weight, if needed.  Changing your diet.  Not smoking.  Using a machine to open your airway while you sleep, such  as: ? An oral appliance. This is a mouthpiece that shifts your lower jaw forward. ? A CPAP device. This device blows air through a mask when you breathe out (exhale). ? An EPAP device. This has valves that you put in each nostril. ? A BPAP device. This device blows air through a mask when you breathe in (inhale) and breathe out.  Having surgery if other treatments do not work. It is important to get treatment for sleep apnea. Without treatment, it can lead to:  High blood pressure.  Coronary artery disease.  In men, not being able to have an erection (impotence).  Reduced thinking ability. Follow these instructions at home: Lifestyle  Make changes that your doctor recommends.  Eat a healthy diet.  Lose weight if needed.  Avoid alcohol, medicines to help you relax, and some pain medicines.  Do not use any products that contain nicotine or tobacco,  such as cigarettes, e-cigarettes, and chewing tobacco. If you need help quitting, ask your doctor. General instructions  Take over-the-counter and prescription medicines only as told by your doctor.  If you were given a machine to use while you sleep, use it only as told by your doctor.  If you are having surgery, make sure to tell your doctor you have sleep apnea. You may need to bring your device with you.  Keep all follow-up visits as told by your doctor. This is important. Contact a doctor if:  The machine that you were given to use during sleep bothers you or does not seem to be working.  You do not get better.  You get worse. Get help right away if:  Your chest hurts.  You have trouble breathing in enough air.  You have an uncomfortable feeling in your back, arms, or stomach.  You have trouble talking.  One side of your body feels weak.  A part of your face is hanging down. These symptoms may be an emergency. Do not wait to see if the symptoms will go away. Get medical help right away. Call your local emergency  services (911 in the [place] Do not drive yourself to the hospital. Summary  This condition affects breathing during sleep.  The most common cause is a collapsed or blocked airway.  The goal of treatment is to help you breathe normally while you sleep. This information is not intended to replace advice given to you by your health care provider. Make sure you discuss any questions you have with your health care provider. Document Revised: 11/28/2017 Document Reviewed: 10/07/2017 Elsevier Patient Education  2020 [employer].
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
## 2019-04-09 LAB — CBC
Hematocrit: 41.4 % (ref 37.5–51.0)
Hemoglobin: 14.2 g/dL (ref 13.0–17.7)
MCH: 30.4 pg (ref 26.6–33.0)
MCHC: 34.3 g/dL (ref 31.5–35.7)
MCV: 89 fL (ref 79–97)
Platelets: 397 x10E3/uL (ref 150–450)
RBC: 4.67 x10E6/uL (ref 4.14–5.80)
RDW: 13.1 % (ref 11.6–15.4)
WBC: 10.9 x10E3/uL — ABNORMAL HIGH (ref 3.4–10.8)

## 2019-04-09 LAB — BASIC METABOLIC PANEL WITH GFR
BUN/Creatinine Ratio: 11 (ref 9–20)
BUN: 11 mg/dL (ref 6–20)
CO2: 24 mmol/L (ref 20–29)
Calcium: 9.3 mg/dL (ref 8.7–10.2)
Chloride: 97 mmol/L (ref 96–106)
Creatinine, Ser: 1 mg/dL (ref 0.76–1.27)
GFR calc Af Amer: 115 mL/min/1.73
GFR calc non Af Amer: 99 mL/min/1.73
Glucose: 99 mg/dL (ref 65–99)
Potassium: 3.8 mmol/L (ref 3.5–5.2)
Sodium: 136 mmol/L (ref 134–144)

## 2019-04-09 LAB — TSH: TSH: 0.727 u[IU]/mL (ref 0.450–4.500)

## 2019-05-03 ENCOUNTER — Ambulatory Visit: Payer: 59 | Attending: Family Medicine | Admitting: Family Medicine

## 2019-05-03 ENCOUNTER — Telehealth: Payer: Self-pay

## 2019-05-03 ENCOUNTER — Encounter: Payer: Self-pay | Admitting: Family Medicine

## 2019-05-03 DIAGNOSIS — G479 Sleep disorder, unspecified: Secondary | ICD-10-CM | POA: Diagnosis not present

## 2019-05-03 DIAGNOSIS — L03116 Cellulitis of left lower limb: Secondary | ICD-10-CM | POA: Diagnosis not present

## 2019-05-03 DIAGNOSIS — F329 Major depressive disorder, single episode, unspecified: Secondary | ICD-10-CM

## 2019-05-03 MED ORDER — CEPHALEXIN 500 MG PO CAPS: 500.0000 mg | ORAL_CAPSULE | Freq: Two times a day (BID) | ORAL | 0 refills | Status: AC
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
## 2019-05-03 NOTE — Progress Notes (Signed)
Depression r/t father passing away. Father's birthday is one day after his.   Would like to be more active than he is now.  Pain in leg is somewhat preventing this 2/10 intermittent Took medication (ATB) leg feels better  Denies swelling but no pain  Sleep study 05/08/2019 Would like to do this at home.

## 2019-05-03 NOTE — Telephone Encounter (Signed)
Message left at [facility] requesting a call back to this CM.  The patient would like a home sleep study. Message also sent to Dr Jillyn Hidden

## 2019-05-04 ENCOUNTER — Telehealth: Payer: Self-pay

## 2019-05-04 NOTE — Telephone Encounter (Signed)
Call received from Terri/[facility].  She said that the patient has requested a home sleep study and the PCP will need to place an order for that.  She also noted that home sleep studies are now being scheduled for the second week of May.  This information was shared with Dr Jillyn Hidden

## 2019-05-05 ENCOUNTER — Other Ambulatory Visit (HOSPITAL_COMMUNITY): Payer: 59

## 2019-05-08 ENCOUNTER — Encounter (HOSPITAL_BASED_OUTPATIENT_CLINIC_OR_DEPARTMENT_OTHER): Payer: 59 | Admitting: Internal Medicine

## 2019-06-29 ENCOUNTER — Telehealth: Payer: Self-pay

## 2019-06-29 NOTE — Telephone Encounter (Signed)
Patient missed his appointment for his sleep study due to thinking that he was doing a home sleep study. Patient is needing a new order placed .  Please follow up.

## 2019-06-30 NOTE — Telephone Encounter (Signed)
Per pt he stated he would like to have provider put order in for him to do his sleep study test order at home not at the facility. Pt stated that he missed his other appt due to thinking it was going to be done at home.

## 2019-07-02 ENCOUNTER — Other Ambulatory Visit: Payer: Self-pay | Admitting: Family Medicine

## 2019-07-02 DIAGNOSIS — E66813 Obesity, class 3: Secondary | ICD-10-CM

## 2019-07-02 DIAGNOSIS — G479 Sleep disorder, unspecified: Secondary | ICD-10-CM

## 2019-07-02 DIAGNOSIS — Z6841 Body Mass Index (BMI) 40.0 and over, adult: Secondary | ICD-10-CM

## 2019-07-02 DIAGNOSIS — R0683 Snoring: Secondary | ICD-10-CM

## 2019-07-02 NOTE — Progress Notes (Signed)
Patient ID: Bob Wolf, male   DOB: [birth year], 34 y.o.   MRN: [id number]   Phone message received from patient that he would like his sleep study to be scheduled at home.

## 2019-07-02 NOTE — Telephone Encounter (Signed)
Let patient know that an order was placed for a home sleep study and that he will be contacted by sleep medicine

## 2019-07-06 NOTE — Telephone Encounter (Signed)
Informed patient with information and he verbalized understanding.  

## 2019-08-02 ENCOUNTER — Encounter (HOSPITAL_COMMUNITY): Payer: Self-pay

## 2019-08-02 ENCOUNTER — Ambulatory Visit (HOSPITAL_COMMUNITY)
Admission: EM | Admit: 2019-08-02 | Discharge: 2019-08-02 | Disposition: A | Discharge status: Home / Self Care | Payer: 59 | Attending: Family Medicine | Admitting: Family Medicine

## 2019-08-02 ENCOUNTER — Other Ambulatory Visit: Payer: Self-pay

## 2019-08-02 DIAGNOSIS — F430 Acute stress reaction: Secondary | ICD-10-CM | POA: Diagnosis not present

## 2019-08-02 DIAGNOSIS — F419 Anxiety disorder, unspecified: Secondary | ICD-10-CM | POA: Diagnosis not present

## 2019-08-02 DIAGNOSIS — R030 Elevated blood-pressure reading, without diagnosis of hypertension: Secondary | ICD-10-CM | POA: Diagnosis not present

## 2019-08-02 LAB — POCT URINALYSIS DIP (DEVICE)
Bilirubin Urine: NEGATIVE
Glucose, UA: NEGATIVE mg/dL
Hgb urine dipstick: NEGATIVE
Ketones, ur: NEGATIVE mg/dL
Leukocytes,Ua: NEGATIVE
Nitrite: NEGATIVE
Protein, ur: NEGATIVE mg/dL
Specific Gravity, Urine: >=1.03 (ref 1.005–1.030)
Urobilinogen, UA: 0.2 mg/dL (ref 0.0–1.0)
pH: 5.5 (ref 5.0–8.0)

## 2019-08-02 NOTE — ED Provider Notes (Signed)
[facility]    CSN: [id number] Arrival date & time: 08/02/19  1726      History   Chief Complaint Chief Complaint  Patient presents with  . Anxiety    HPI Bob Wolf is a 34 y.o. male.   Patient reports that he has been having "weird" feelings to the back of his arms as well as his fingertips going on for the last 2 weeks.  Reports episodes are lasting about 10 to 15 minutes. Reports that he is also feeling some tense muscles in the back of his arms and shoulders.  Has history of PTSD, has history of father passing away in January of this year. Denies taking any OTC medications for this.  Denies anything making the episodes worse or better. Reports episodes of tachycardia as well, with heart rate up into the 130s at rest.  Patient is current daily smoker.  eports that his mother and father both have history of diabetes and high blood pressure. Reports that his siblings have diabetes as well.  He is concerned and stressed about this.  ROS per HPI  The history is provided by the patient.    Past Medical History:  Diagnosis Date  . Allergy   . Anxiety   . Depression   . Herpes   . Substance abuse [facility])     Patient Active Problem List   Diagnosis Date Noted  . Substance abuse (HCC) 09/27/2016  . Anxiety and depression 09/27/2016  . 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 CHOLECYSTECTOMY  2003       Home Medications    Prior to Admission medications   Medication Sig Start Date End Date Taking? Authorizing Provider  cetirizine (ZYRTEC) 10 MG tablet Take 10 mg by mouth daily.    [provider]  ibuprofen (ADVIL) 200 MG tablet Take 600 mg by mouth every 6 (six) hours as needed for moderate pain.    [provider]  naproxen sodium (ALEVE) 220 MG tablet Take 440 mg by mouth 2 (two) times daily as needed (pain).    [provider]    Family History Family History  Problem Relation Age  of Onset  . Hypertension Mother   . Diabetes Mother   . Hypertension Father   . Diabetes Father   . Hypertension Sister     Social History Social History   Tobacco Use  . Smoking status: Current Every Day Smoker    Packs/day: 1.00    Years: 10.00    Pack years: 10.00    Types: Cigarettes  . Smokeless tobacco: Never Used  Substance Use Topics  . Alcohol use: Not Currently    Alcohol/week: 6.0 standard drinks    Types: 6 Shots of liquor per week    Comment: 0-1 shot per week  . Drug use: No    Types: Marijuana    Comment: Stopped using at the end of June. Was using 4 x weekly prior to that.     Allergies   Other   Review of Systems Review of Systems   Physical Exam Triage Vital Signs ED Triage Vitals  Enc Vitals Group     BP 08/02/19 1808 (!) 150/100     Pulse Rate 08/02/19 1808 (!) 109     Resp 08/02/19 1808 16     Temp 08/02/19 1808 98.2 F (36.8 C)     Temp src --      SpO2 08/02/19 1808  98 %     Weight --      Height --      Head Circumference --      Peak Flow --      Pain Score 08/02/19 1809 0     Pain Loc --      Pain Edu? --      Excl. in [place]? --    No data found.  Updated Vital Signs BP (!) 150/100   Pulse (!) 109   Temp 98.2 F (36.8 C)   Resp 16   SpO2 98%   Visual Acuity Right Eye Distance:   Left Eye Distance:   Bilateral Distance:    Right Eye Near:   Left Eye Near:    Bilateral Near:     Physical Exam Vitals and nursing note reviewed.  Constitutional:      General: He is not in acute distress.    Appearance: He is well-developed. He is obese. He is not ill-appearing.  HENT:     Head: Normocephalic and atraumatic.  Eyes:     Conjunctiva/sclera: Conjunctivae normal.  Cardiovascular:     Rate and Rhythm: Regular rhythm. Tachycardia present.     Pulses: Normal pulses.     Heart sounds: Normal heart sounds. No murmur.  Pulmonary:     Effort: Pulmonary effort is normal. No respiratory distress.     Breath sounds: Normal  breath sounds. No stridor. No wheezing, rhonchi or rales.  Chest:     Chest wall: No tenderness.  Abdominal:     Palpations: Abdomen is soft.     Tenderness: There is no abdominal tenderness.  Musculoskeletal:     Cervical back: Normal range of motion and neck supple.  Skin:    General: Skin is warm and dry.     Capillary Refill: Capillary refill takes less than 2 seconds.  Neurological:     General: No focal deficit present.     Mental Status: He is alert and oriented to person, place, and time.  Psychiatric:        Mood and Affect: Mood normal.        Behavior: Behavior normal.      UC Treatments / Results  Labs (all labs ordered are listed, but only abnormal results are displayed) Labs Reviewed  POCT URINALYSIS DIP (DEVICE)    EKG   Radiology No results found.  Procedures Procedures (including critical care time)  Medications Ordered in UC Medications - No data to display  Initial Impression / Assessment and Plan / UC Course  I have reviewed the triage vital signs and the nursing notes.  Pertinent labs & imaging results that were available during my care of the patient were reviewed by me and considered in my medical decision making (see chart for details).    Anxiety Elevated Blood Pressure Reading Stress Reaction BP 129/80 on recheck by me Issues are probably being caused by anxiety at this point. Very concerned about having diabetes today, UA negative for glucose Discussed counseling for panic attacks, patient agreeable Information provided for behavioral health Follow-up with this office or with the ER if symptoms are not improving If you are having chest pain, sweating, shortness of breath, trouble swallowing, other concerning symptoms follow-up with the ER. Patient verbalized understanding is in agreement with treatment plan. Final Clinical Impressions(s) / UC Diagnoses   Final diagnoses:  Anxiety  Elevated blood-pressure reading without diagnosis  of hypertension  Stress reaction     Discharge Instructions  I think you are experiencing anxiety  I have attached some information for you on this  I have also included Behavioral Health contact info    ED Prescriptions    None     PDMP not reviewed this encounter.   Moshe Cipro, NP 08/02/19 1938

## 2019-08-02 NOTE — Discharge Instructions (Signed)
I think you are experiencing anxiety  I have attached some information for you on this  I have also included Behavioral Health contact info

## 2019-08-02 NOTE — ED Triage Notes (Addendum)
Pt c/o heart racing and bilateral arm tingling when feeling anxious. Pt has history of PTSD. Father passed away at the beginning of the year and symptoms started after that. States he has stretches of staying up 24-48 hours and then sleeping 24-48 hours.

## 2019-08-18 ENCOUNTER — Other Ambulatory Visit: Payer: Self-pay

## 2019-08-18 ENCOUNTER — Encounter (HOSPITAL_COMMUNITY): Payer: Self-pay | Admitting: Emergency Medicine

## 2019-08-18 ENCOUNTER — Emergency Department (HOSPITAL_COMMUNITY): Payer: 59

## 2019-08-18 ENCOUNTER — Emergency Department (HOSPITAL_COMMUNITY)
Admission: EM | Admit: 2019-08-18 | Discharge: 2019-08-18 | Disposition: A | Discharge status: Home / Self Care | Payer: 59 | Attending: Emergency Medicine | Admitting: Emergency Medicine

## 2019-08-18 DIAGNOSIS — R202 Paresthesia of skin: Secondary | ICD-10-CM | POA: Diagnosis not present

## 2019-08-18 DIAGNOSIS — R0789 Other chest pain: Secondary | ICD-10-CM | POA: Diagnosis present

## 2019-08-18 DIAGNOSIS — M79602 Pain in left arm: Secondary | ICD-10-CM | POA: Insufficient documentation

## 2019-08-18 DIAGNOSIS — R200 Anesthesia of skin: Secondary | ICD-10-CM | POA: Insufficient documentation

## 2019-08-18 DIAGNOSIS — Z5321 Procedure and treatment not carried out due to patient leaving prior to being seen by health care provider: Secondary | ICD-10-CM | POA: Diagnosis not present

## 2019-08-18 DIAGNOSIS — M79601 Pain in right arm: Secondary | ICD-10-CM | POA: Insufficient documentation

## 2019-08-18 LAB — CBC
HCT: 45.7 % (ref 39.0–52.0)
Hemoglobin: 14.8 g/dL (ref 13.0–17.0)
MCH: 29.6 pg (ref 26.0–34.0)
MCHC: 32.4 g/dL (ref 30.0–36.0)
MCV: 91.4 fL (ref 80.0–100.0)
Platelets: 391 10*3/uL (ref 150–400)
RBC: 5 MIL/uL (ref 4.22–5.81)
RDW: 13.4 % (ref 11.5–15.5)
WBC: 10.3 10*3/uL (ref 4.0–10.5)
nRBC: 0 % (ref 0.0–0.2)

## 2019-08-18 LAB — BASIC METABOLIC PANEL
Anion gap: 12 (ref 5–15)
BUN: 14 mg/dL (ref 6–20)
CO2: 25 mmol/L (ref 22–32)
Calcium: 8.9 mg/dL (ref 8.9–10.3)
Chloride: 101 mmol/L (ref 98–111)
Creatinine, Ser: 1.02 mg/dL (ref 0.61–1.24)
GFR calc Af Amer: >60 mL/min (ref >60)
GFR calc non Af Amer: >60 mL/min (ref >60)
Glucose, Bld: 128 mg/dL — ABNORMAL HIGH (ref 70–99)
Potassium: 3.3 mmol/L — ABNORMAL LOW (ref 3.5–5.1)
Sodium: 138 mmol/L (ref 135–145)

## 2019-08-18 LAB — TROPONIN I (HIGH SENSITIVITY)
Troponin I (High Sensitivity): <2 ng/L (ref <18)
Troponin I (High Sensitivity): 5 ng/L (ref <18)

## 2019-08-18 MED ORDER — SODIUM CHLORIDE 0.9% FLUSH: 3.0000 mL | Freq: Once | INTRAVENOUS | Status: DC

## 2019-08-18 NOTE — ED Notes (Signed)
Pt called for vitals x3, no response.  

## 2019-08-18 NOTE — ED Notes (Addendum)
Pt called x 3  No answer. 

## 2019-08-18 NOTE — ED Triage Notes (Signed)
Pt reports chest pain with pain to both arms, that started last night, however he endorses numbness and tingling to both arms and legs that has been going on for a few months. States now only his arms feels numb and tingling. Pt a/ox4, speech clear, no neuro deficits. Hx of anxiety.

## 2019-08-19 ENCOUNTER — Other Ambulatory Visit: Payer: Self-pay

## 2019-08-19 ENCOUNTER — Encounter (HOSPITAL_COMMUNITY): Payer: Self-pay | Admitting: Emergency Medicine

## 2019-08-19 ENCOUNTER — Ambulatory Visit (HOSPITAL_COMMUNITY)
Admission: EM | Admit: 2019-08-19 | Discharge: 2019-08-19 | Disposition: A | Discharge status: Home / Self Care | Payer: 59 | Attending: Physician Assistant | Admitting: Physician Assistant

## 2019-08-19 DIAGNOSIS — E876 Hypokalemia: Secondary | ICD-10-CM

## 2019-08-19 DIAGNOSIS — F411 Generalized anxiety disorder: Secondary | ICD-10-CM | POA: Diagnosis not present

## 2019-08-19 MED ORDER — HYDROXYZINE HCL 25 MG PO TABS: 25.0000 mg | ORAL_TABLET | Freq: Four times a day (QID) | ORAL | 0 refills | Status: AC

## 2019-08-19 NOTE — ED Provider Notes (Signed)
[facility]    CSN: [id number] Arrival date & time: 08/19/19  1851      History   Chief Complaint Chief Complaint  Patient presents with  . Tingling    HPI Bob Wolf is a 34 y.o. male.   Patient presents urgent care for evaluation of arm tingling and episodes of chest tightness.  He reports he has had arm tingling in both arms for over a month now.  He was evaluated July 28, 2019 for similar thought to have anxiety, and he agrees with this.  He reports this happens when feeling anxious.  He will feel tingling down the backs of both arms.  He also reports he feels heart racing during these episodes.  He also becomes sweaty at times.  He reports these episodes take a short amount of time to resolve.  He does however note that he had spasm-like muscle contractions in the left arm over the last few days.  He does report some chest tightness yesterday which prompted him to go to the emergency department.  He reports he had basic work-up done however left prior to being seen.  He reports symptoms improved while at the ED and this prompted him leave.  He reports he was feeling anxious as the symptoms were occurring and after he got to the hospital he became less anxious.  He reports is not feeling very anxious here today and is having minimal tingling in his arms.  He denies any thoughts of self-harm.  He did not investigate mental health resources given has he has been waiting for his new insurance to start.  [facility].  He has not discussed this with his primary care yet.   Patient assures me he is not having thoughts self-harm.      Past Medical History:  Diagnosis Date  . Allergy   . Anxiety   . Depression   . Herpes   . Substance abuse [facility])     Patient Active Problem List   Diagnosis Date Noted  . Substance abuse (HCC) 09/27/2016  . Anxiety and depression 09/27/2016  . Nut allergy  09/23/2016    Past Surgical History:  Procedure Laterality Date  . CHOLECYSTECTOMY  2003       Home Medications    Prior to Admission medications   Medication Sig Start Date End Date Taking? Authorizing Provider  cetirizine (ZYRTEC) 10 MG tablet Take 10 mg by mouth daily.    [provider]  hydrOXYzine (ATARAX/VISTARIL) 25 MG tablet Take 1 tablet (25 mg total) by mouth every 6 (six) hours. 08/19/19   Khamia Stambaugh, Veryl Speak, PA-C  ibuprofen (ADVIL) 200 MG tablet Take 600 mg by mouth every 6 (six) hours as needed for moderate pain.    [provider]  naproxen sodium (ALEVE) 220 MG tablet Take 440 mg by mouth 2 (two) times daily as needed (pain).    [provider]    Family History Family History  Problem Relation Age of Onset  . Hypertension Mother   . Diabetes Mother   . Hypertension Father   . Diabetes Father   . Hypertension Sister     Social History Social History   Tobacco Use  . Smoking status: Current Every Day Smoker    Packs/day: 1.00    Years: 10.00    Pack years: 10.00    Types: Cigarettes  . Smokeless tobacco: Never Used  Vaping  Use  . Vaping Use: Never used  Substance Use Topics  . Alcohol use: Not Currently    Alcohol/week: 6.0 standard drinks    Types: 6 Shots of liquor per week    Comment: 0-1 shot per week  . Drug use: No    Types: Marijuana    Comment: Stopped using at the end of June. Was using 4 x weekly prior to that.     Allergies   Other   Review of Systems Review of Systems   Physical Exam Triage Vital Signs ED Triage Vitals  Enc Vitals Group     BP 08/19/19 1926 139/80     Pulse Rate 08/19/19 1926 77     Resp 08/19/19 1926 20     Temp 08/19/19 1926 98.4 F (36.9 C)     Temp src --      SpO2 08/19/19 1926 98 %     Weight --      Height --      Head Circumference --      Peak Flow --      Pain Score 08/19/19 1924 3     Pain Loc --      Pain Edu? --      Excl. in [place]? --    No data  found.  Updated Vital Signs BP 139/80 (BP Location: Right Arm)   Pulse 77   Temp 98.4 F (36.9 C)   Resp 20   SpO2 98%   Visual Acuity Right Eye Distance:   Left Eye Distance:   Bilateral Distance:    Right Eye Near:   Left Eye Near:    Bilateral Near:     Physical Exam Vitals and nursing note reviewed.  Constitutional:      Appearance: He is well-developed.  HENT:     Head: Normocephalic and atraumatic.  Eyes:     Conjunctiva/sclera: Conjunctivae normal.  Cardiovascular:     Rate and Rhythm: Normal rate and regular rhythm.     Heart sounds: No murmur heard.   Pulmonary:     Effort: Pulmonary effort is normal. No respiratory distress.     Breath sounds: Normal breath sounds.  Abdominal:     Palpations: Abdomen is soft.     Tenderness: There is no abdominal tenderness.  Musculoskeletal:     Cervical back: Neck supple.     Comments: Sensation equal bilaterally in the upper extremities.  Sensation 5/5  Skin:    General: Skin is warm and dry.  Neurological:     Mental Status: He is alert.      UC Treatments / Results  Labs (all labs ordered are listed, but only abnormal results are displayed) Labs Reviewed - No data to display  EKG   Radiology DG Chest 2 View  Result Date: 08/18/2019 CLINICAL DATA:  Chest pain and pain to bilateral arms which began last night. Additional provided: Numbness and tingling to both arms and legs for several months. EXAM: CHEST - 2 VIEW COMPARISON:  Chest radiograph 01/09/2016. FINDINGS: Heart size within normal limits. There is no appreciable airspace consolidation. No evidence of pleural effusion or pneumothorax. No acute bony abnormality identified. IMPRESSION: No evidence of acute cardiopulmonary abnormality. Electronically Signed   By: Jackey Loge DO   On: 08/18/2019 13:39    Procedures Procedures (including critical care time)  Medications Ordered in UC Medications - No data to display  Initial Impression / Assessment  and Plan / UC Course  I have reviewed the  triage vital signs and the nursing notes.  Pertinent labs & imaging results that were available during my care of the patient were reviewed by me and considered in my medical decision making (see chart for details).     #Anxiety state #Hypokalemia Patient is a 34 year old presenting with likely general anxiety disorder, and anxiety state.  Labs reviewed from 08/18/2019 emergency department visit.  Found to have slightly low potassium at 3.3.  Otherwise very reassuring work-up, troponins negative.  Chest x-ray without pathology.  EKG normal sinus rhythm.  I feel that his symptoms are denies anxiety.  I discussed that would be beneficial for the patient to discuss this with his primary care or reach out to one of the mental health resources that we have given him.  I also gave him a [place] office number and address for possible walk-in.  Discussed the use of hydroxyzine and acute anxiety states.  Discussed what he should do if feeling like this or having self-harm, suicide hotline supplied.  Since increasing potassium added to incorporate electrolyte beverage and this should resolve slightly low potassium without issue.  Patient verbalized understanding the plan of care. Final Clinical Impressions(s) / UC Diagnoses   Final diagnoses:  Anxiety state  Hypokalemia     Discharge Instructions     Take the hydroxyzine if feelign anxious, do not drive within 6 hours of taking this.  Eat potassium rich foods and pedialyte 1 bottle a day for the next 2 days  Schedule follow up with your PCP  If feeling overwhelmed or thoughts of self harm: call the suicide prevention [phone]   [facility]   (109)  Mental health clinic [facility], [place]  [phone] Closed ? Opens 8:30AM Fri   Follow up with a mental health provider at your discretion         ED Prescriptions    Medication Sig Dispense  Auth. Provider   hydrOXYzine (ATARAX/VISTARIL) 25 MG tablet Take 1 tablet (25 mg total) by mouth every 6 (six) hours. 20 tablet Talicia Sui, Marguerita Beards, PA-C     PDMP not reviewed this encounter.   Purnell Shoemaker, PA-C 08/19/19 2355

## 2019-08-19 NOTE — ED Triage Notes (Signed)
tingling in both arms.  Denies neck pain.  Chronic lower back pain.  Chest tightness.  Intermittent pain for 2 weeks.  Patient went to ED 6/23, left early because he was feeling better and wait was so long

## 2019-08-19 NOTE — Discharge Instructions (Signed)
Take the hydroxyzine if feelign anxious, do not drive within 6 hours of taking this.  Eat potassium rich foods and pedialyte 1 bottle a day for the next 2 days  Schedule follow up with your PCP  If feeling overwhelmed or thoughts of self harm: call the suicide prevention [phone]   [facility]   (109)  Mental health clinic [facility], [address]  [phone] Closed ? Opens 8:30AM Fri   Follow up with a mental health provider at your discretion

## 2019-09-17 ENCOUNTER — Ambulatory Visit (HOSPITAL_BASED_OUTPATIENT_CLINIC_OR_DEPARTMENT_OTHER): Payer: 59 | Attending: Family Medicine | Admitting: Internal Medicine

## 2019-09-17 ENCOUNTER — Other Ambulatory Visit: Payer: Self-pay

## 2019-09-17 DIAGNOSIS — Z6841 Body Mass Index (BMI) 40.0 and over, adult: Secondary | ICD-10-CM | POA: Insufficient documentation

## 2019-09-17 DIAGNOSIS — G4733 Obstructive sleep apnea (adult) (pediatric): Secondary | ICD-10-CM | POA: Insufficient documentation

## 2019-09-17 DIAGNOSIS — G479 Sleep disorder, unspecified: Secondary | ICD-10-CM

## 2019-09-17 DIAGNOSIS — R0683 Snoring: Secondary | ICD-10-CM | POA: Diagnosis not present

## 2019-09-25 DIAGNOSIS — G479 Sleep disorder, unspecified: Secondary | ICD-10-CM

## 2019-09-25 DIAGNOSIS — Z6841 Body Mass Index (BMI) 40.0 and over, adult: Secondary | ICD-10-CM

## 2019-09-25 DIAGNOSIS — R0683 Snoring: Secondary | ICD-10-CM

## 2019-09-25 NOTE — Procedures (Signed)
   Patient Name: Bob Wolf, Bob Wolf Date: 09/20/2019 Gender: Male D.O.B: 21-Oct-1985 Age (years): 34 Referring Provider: Cammie Fulp Height (inches): 72 Interpreting Physician: Jetty Duhamel MD, ABSM Weight (lbs): 320 RPSGT: [place] Sink BMI: 43 MRN: [id number] Neck Size: 17.50  CLINICAL INFORMATION Sleep Study Type: HST Indication for sleep study: OSA Epworth Sleepiness Score: 8  SLEEP STUDY TECHNIQUE A multi-channel overnight portable sleep study was performed. The channels recorded were: nasal airflow, thoracic respiratory movement, and oxygen saturation with a pulse oximetry. Snoring was also monitored.  MEDICATIONS Patient self administered medications include: none reported.  SLEEP ARCHITECTURE Patient was studied for 578.4 minutes. The sleep efficiency was 100.0 % and the patient was supine for 59.5%. The arousal index was 0.0 per hour.  RESPIRATORY PARAMETERS The overall AHI was 35.7 per hour, with a central apnea index of 0.0 per hour. The oxygen nadir was 85% during sleep.  CARDIAC DATA Mean heart rate during sleep was 73.3 bpm.  IMPRESSIONS - Severe obstructive sleep apnea occurred during this study (AHI = 35.7/h). - No significant central sleep apnea occurred during this study (CAI = 0.0/h). - Moderate oxygen desaturation was noted during this study (Min O2 = 85%). - Patient snored.  DIAGNOSIS - Obstructive Sleep Apnea (G47.33)  RECOMMENDATIONS - Suggest CPAP titration sleep study or autopap. Other options would be based on clinical judgment. - Be careful with alcohol, sedatives and other CNS depressants that may worsen sleep apnea and disrupt normal sleep architecture. - Sleep hygiene should be reviewed to assess factors that may improve sleep quality. - Weight management and regular exercise should be initiated or continued.  [Electronically signed] 09/25/2019 10:30 AM  Jetty Duhamel MD, ABSM Diplomate, American Board of Sleep  Medicine   NPI: [id number]                          Jetty Duhamel Diplomate, American Board of Sleep Medicine  ELECTRONICALLY SIGNED ON:  09/25/2019, 10:28 AM [facility] PH: [phone]   FX: [phone] ACCREDITED BY THE AMERICAN ACADEMY OF SLEEP MEDICINE

## 2019-09-27 ENCOUNTER — Other Ambulatory Visit: Payer: Self-pay | Admitting: Internal Medicine

## 2019-09-27 DIAGNOSIS — G4733 Obstructive sleep apnea (adult) (pediatric): Secondary | ICD-10-CM | POA: Insufficient documentation

## 2019-10-18 NOTE — Progress Notes (Signed)
Please review impression and advise for patient. Results were routed from RMA to Triage RN without being resulted by a provider within the office due to PCP being OOO indefinitely.

## 2019-10-20 ENCOUNTER — Telehealth: Payer: Self-pay | Admitting: Critical Care Medicine

## 2019-10-20 ENCOUNTER — Telehealth: Payer: Self-pay

## 2019-10-20 DIAGNOSIS — G4733 Obstructive sleep apnea (adult) (pediatric): Secondary | ICD-10-CM

## 2019-10-20 NOTE — Telephone Encounter (Signed)
-----   Message from Margaretmary Lombard, [place] sent at 10/18/2019  4:59 PM EDT ----- Please result impression for patient and advise of next step while PCP is OOO.

## 2019-10-20 NOTE — Telephone Encounter (Signed)
I tried to call this pt,  He has severe sleep apnea.  I am ordering cpap for him, he is insured.  He needs OV with me in Sept

## 2019-10-20 NOTE — Telephone Encounter (Addendum)
Attempted to contact patient # [phone] to inform him that Dr Delford Field is prescribing a CPAP machine for him. Message left with call back requested to this CM. Also need to schedule follow up appointment with Dr Delford Field for 10/2019.  Call placed to patient's insurance company, [employer] # [phone] to inquire what DME company is in network for this machine. Spoke to [place], who stated that the patient's policy terminated 09/26/2019.   Will need to check with patient when he calls back to see if he has another Brewing technologist.

## 2019-10-20 NOTE — Telephone Encounter (Signed)
Tied contacting patient to schedule an appt. No VM

## 2019-10-20 NOTE — Progress Notes (Signed)
Please schedule patient with dr Delford Field, per dr Delford Field, to discuss sleep study results and treatment.

## 2019-10-22 NOTE — Telephone Encounter (Signed)
Called patient to schedule an appt with Dr. Delford Field to discuss his sleep study results and his VM is full.

## 2019-10-29 NOTE — Telephone Encounter (Signed)
Attempted again  to contact patient # [phone] to inform him that Dr Delford Field is prescribing a CPAP machine for him. Message left with call back requested to this CM. Also need to schedule follow up appointment with Dr Delford Field for 10/2019.   Need to confirm patient's insurance as the policy noted in Epic terminated 09/26/2019

## 2019-11-18 ENCOUNTER — Telehealth: Payer: Self-pay

## 2019-11-18 NOTE — Telephone Encounter (Signed)
Attempted again  to contact patient # [phone] to inform him that Dr Delford Field is prescribing a CPAP machine for him. Message left with call back requested to this CM.Also need to schedule follow up appointment with Dr Delford Field. Message left with call back requested to this CM.   Letter also sent to patient informing him that we have been trying to reach him and requesting he call this office. The address on file is in [place]

## 2021-03-11 IMAGING — DX DG CHEST 2V
2 series · 2 of 2 positions shown · non-contrast
Comparison: Chest radiograph 01/09/2016.

CLINICAL DATA: Chest pain and pain to bilateral arms which began
last night. Additional provided: Numbness and tingling to both arms
and legs for several months.

EXAM:
CHEST - 2 VIEW

[w chest pa]
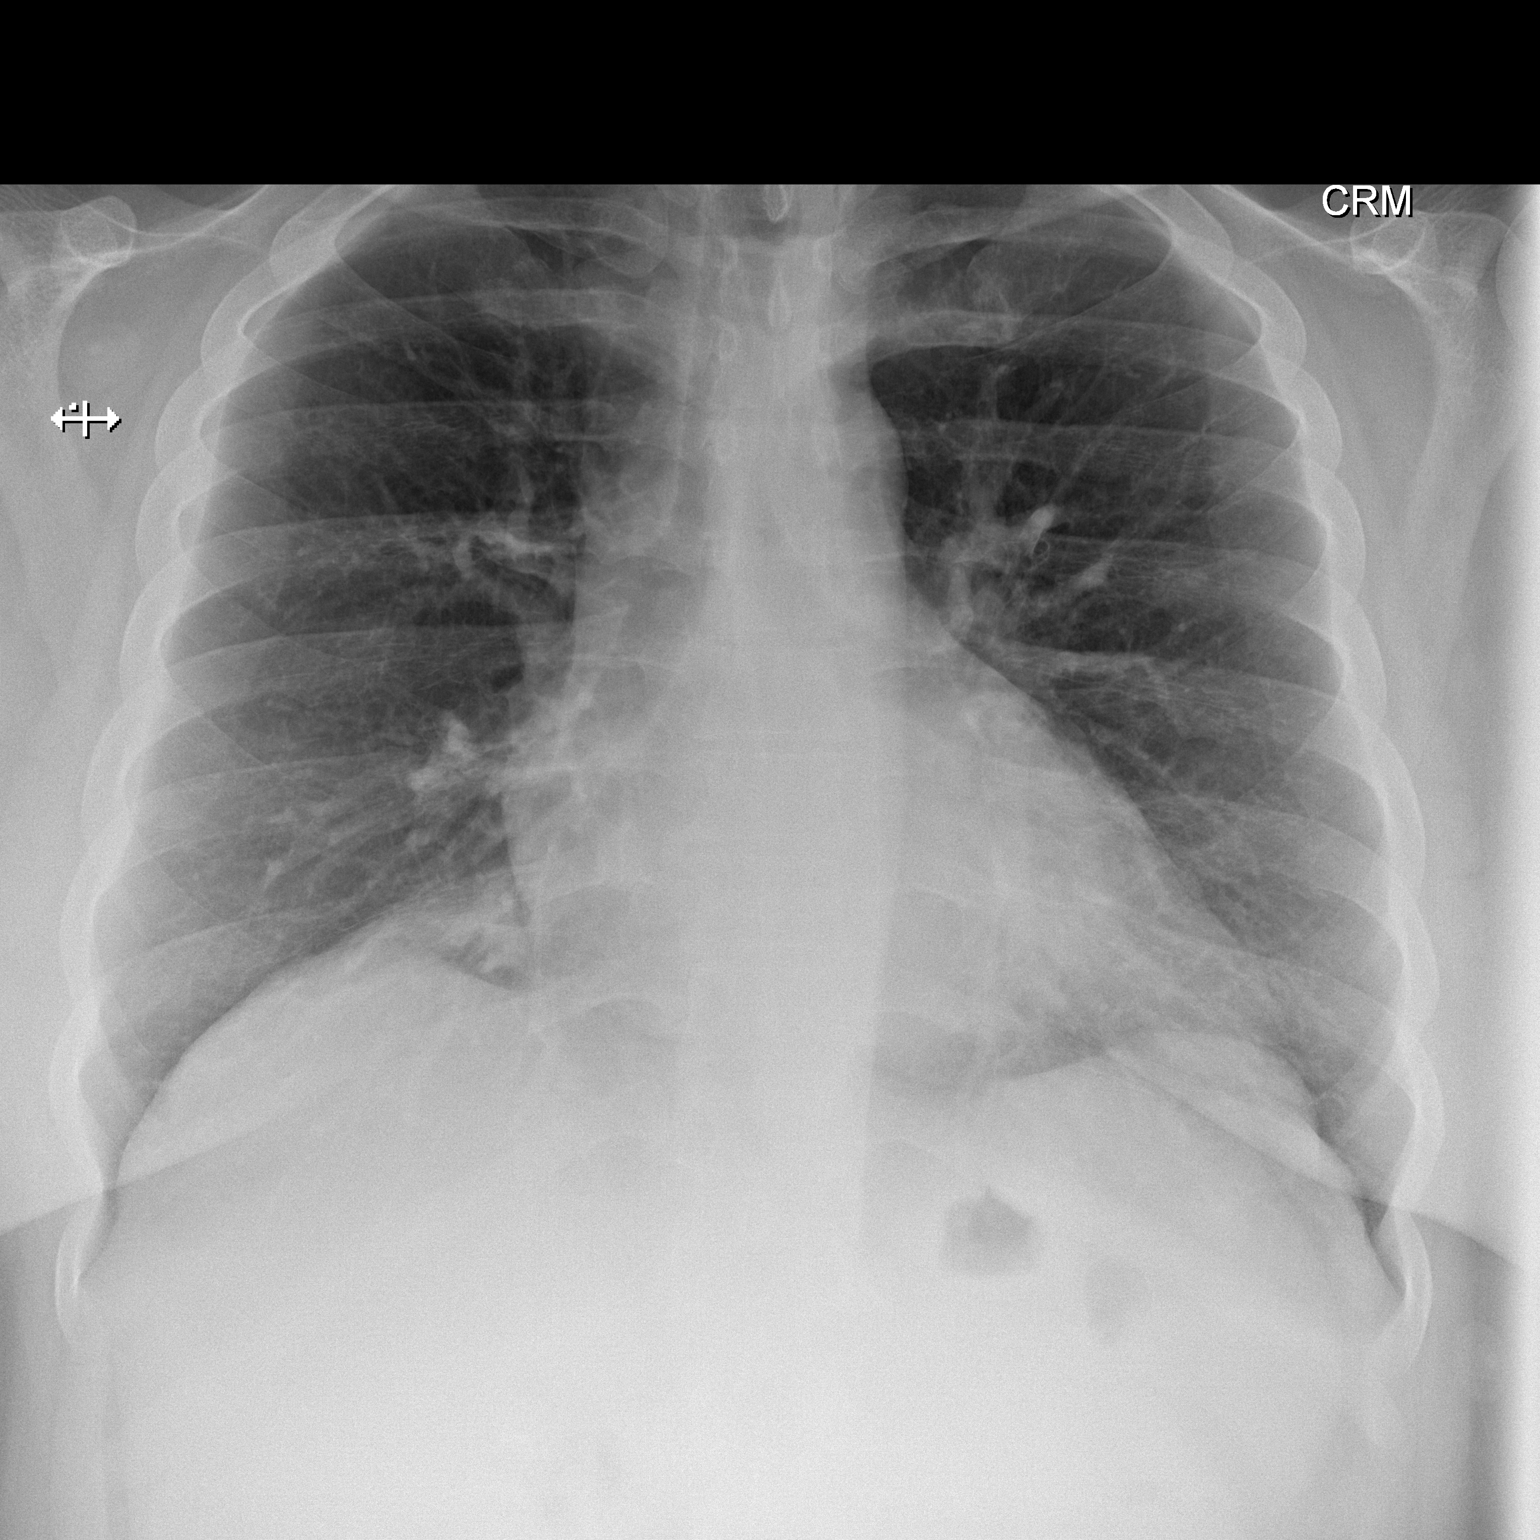

[w chest lat]
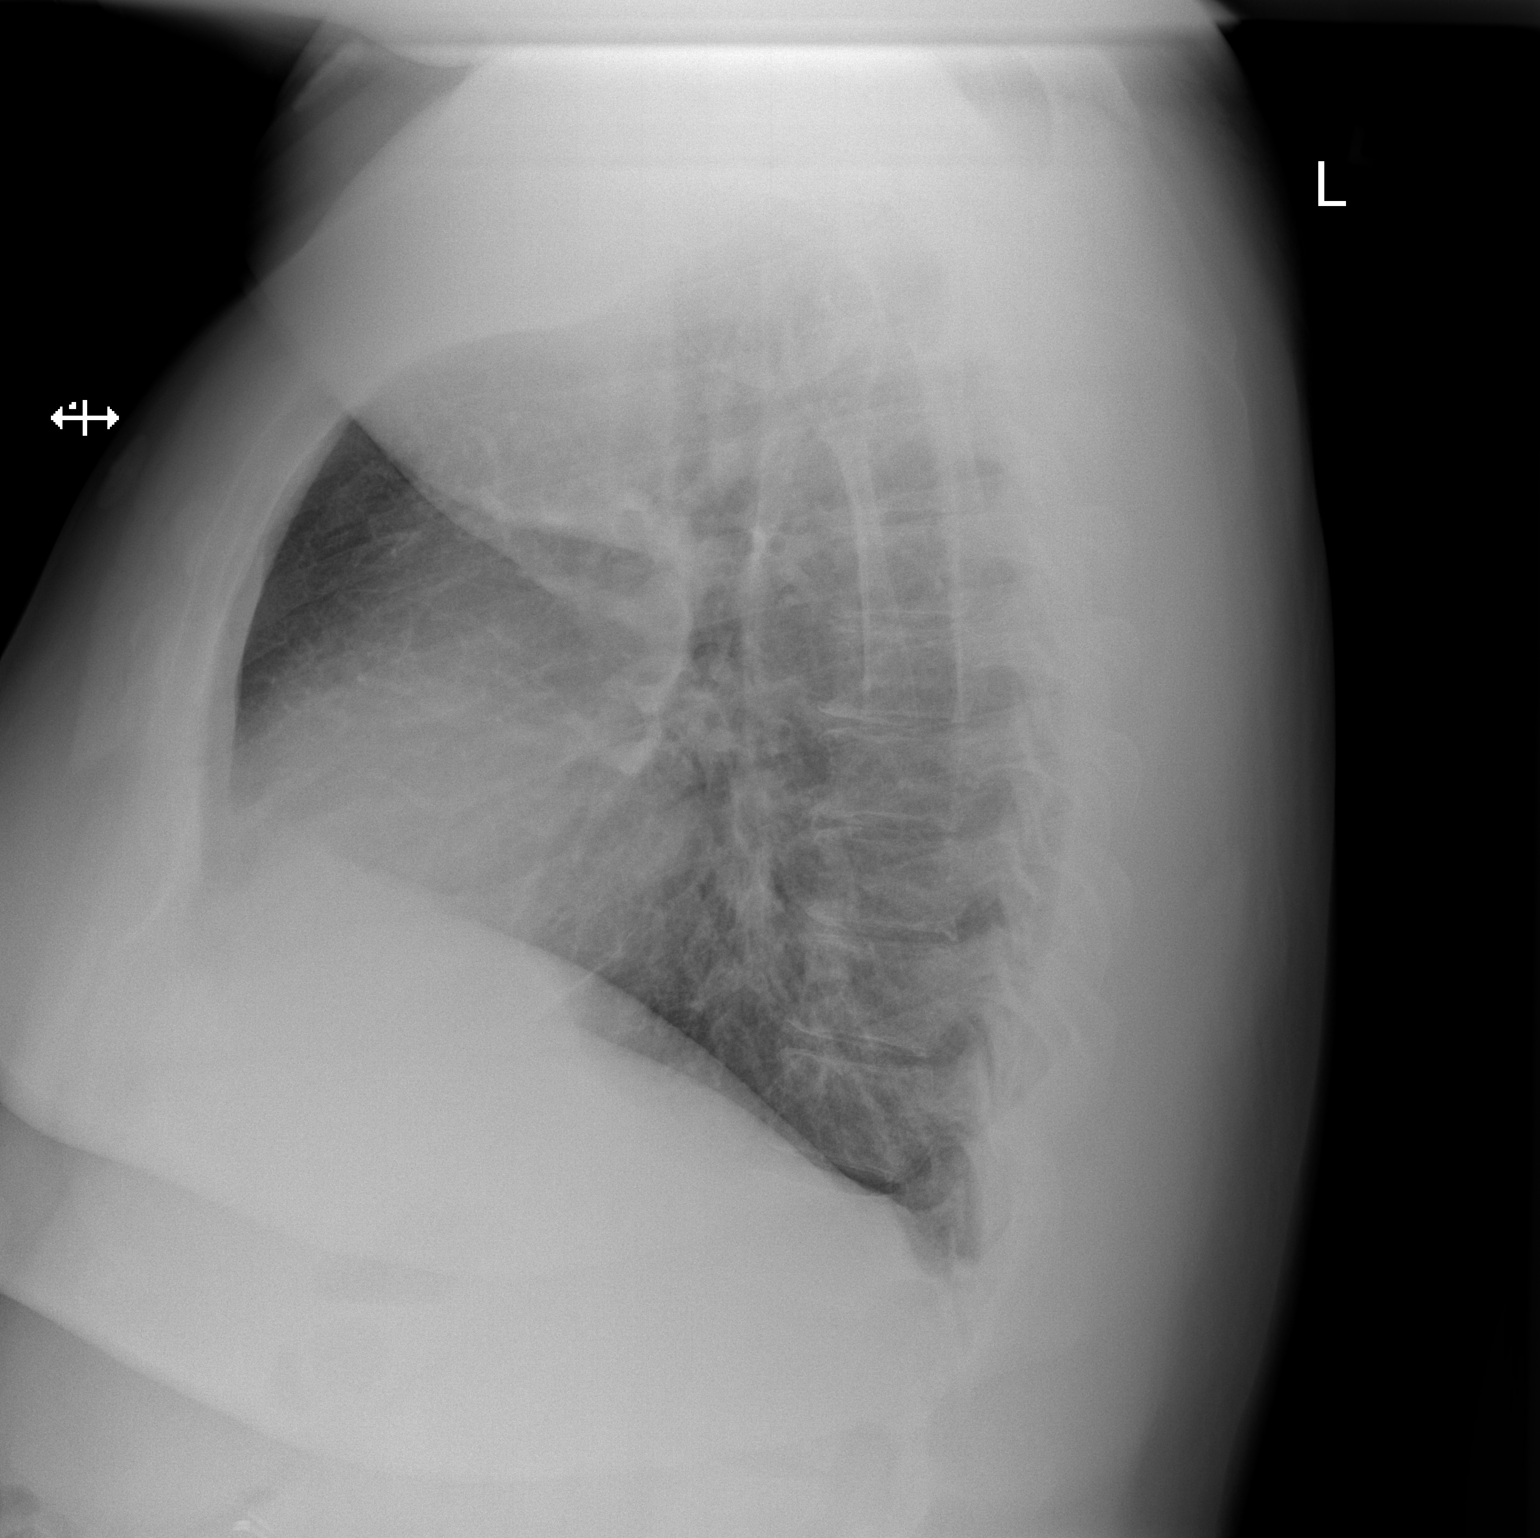

[2 of 2 positions shown; findings below may reference images not displayed]

FINDINGS: Heart size within normal limits.

There is no appreciable airspace consolidation.

No evidence of pleural effusion or pneumothorax.

No acute bony abnormality identified.
IMPRESSION: No evidence of acute cardiopulmonary abnormality.
